# Patient Record
Sex: Female | Born: 1935 | Race: White | Hispanic: No | Marital: Married | Smoking: Never smoker
Health system: Southern US, Community
[De-identification: ages and names within clinical notes are randomized; demographics above are authoritative.]

## PROBLEM LIST (undated history)

## (undated) DIAGNOSIS — I714 Abdominal aortic aneurysm, without rupture, unspecified: Secondary | ICD-10-CM

## (undated) DIAGNOSIS — I4729 Other ventricular tachycardia: Secondary | ICD-10-CM

## (undated) DIAGNOSIS — I779 Disorder of arteries and arterioles, unspecified: Secondary | ICD-10-CM

## (undated) DIAGNOSIS — I480 Paroxysmal atrial fibrillation: Secondary | ICD-10-CM

## (undated) DIAGNOSIS — R739 Hyperglycemia, unspecified: Secondary | ICD-10-CM

## (undated) DIAGNOSIS — I739 Peripheral vascular disease, unspecified: Secondary | ICD-10-CM

## (undated) DIAGNOSIS — K219 Gastro-esophageal reflux disease without esophagitis: Secondary | ICD-10-CM

## (undated) DIAGNOSIS — E871 Hypo-osmolality and hyponatremia: Secondary | ICD-10-CM

## (undated) DIAGNOSIS — I472 Ventricular tachycardia: Secondary | ICD-10-CM

## (undated) DIAGNOSIS — R7401 Elevation of levels of liver transaminase levels: Secondary | ICD-10-CM

## (undated) DIAGNOSIS — E039 Hypothyroidism, unspecified: Secondary | ICD-10-CM

## (undated) DIAGNOSIS — I10 Essential (primary) hypertension: Secondary | ICD-10-CM

## (undated) DIAGNOSIS — R740 Nonspecific elevation of levels of transaminase and lactic acid dehydrogenase [LDH]: Secondary | ICD-10-CM

## (undated) DIAGNOSIS — I2510 Atherosclerotic heart disease of native coronary artery without angina pectoris: Secondary | ICD-10-CM

## (undated) DIAGNOSIS — N189 Chronic kidney disease, unspecified: Secondary | ICD-10-CM

## (undated) DIAGNOSIS — I5022 Chronic systolic (congestive) heart failure: Secondary | ICD-10-CM

## (undated) DIAGNOSIS — E785 Hyperlipidemia, unspecified: Secondary | ICD-10-CM

## (undated) DIAGNOSIS — J449 Chronic obstructive pulmonary disease, unspecified: Secondary | ICD-10-CM

## (undated) HISTORY — PX: CORONARY ARTERY BYPASS GRAFT: SHX141

## (undated) HISTORY — PX: CHOLECYSTECTOMY: SHX55

## (undated) HISTORY — PX: CARDIAC CATHETERIZATION: SHX172

---

## 2015-08-21 ENCOUNTER — Inpatient Hospital Stay (HOSPITAL_COMMUNITY)
Admission: AD | Admit: 2015-08-21 | Discharge: 2015-09-24 | DRG: 286 | Disposition: E | Payer: Medicare Other | Source: Other Acute Inpatient Hospital | Attending: Internal Medicine | Admitting: Internal Medicine

## 2015-08-21 ENCOUNTER — Encounter (HOSPITAL_COMMUNITY): Payer: Self-pay | Admitting: Physician Assistant

## 2015-08-21 ENCOUNTER — Encounter (HOSPITAL_COMMUNITY): Admission: AD | Disposition: E | Payer: Self-pay | Source: Other Acute Inpatient Hospital | Attending: Internal Medicine

## 2015-08-21 ENCOUNTER — Inpatient Hospital Stay (HOSPITAL_COMMUNITY): Payer: Medicare Other

## 2015-08-21 DIAGNOSIS — I480 Paroxysmal atrial fibrillation: Secondary | ICD-10-CM | POA: Diagnosis present

## 2015-08-21 DIAGNOSIS — I255 Ischemic cardiomyopathy: Secondary | ICD-10-CM | POA: Diagnosis present

## 2015-08-21 DIAGNOSIS — Z66 Do not resuscitate: Secondary | ICD-10-CM | POA: Diagnosis present

## 2015-08-21 DIAGNOSIS — I5023 Acute on chronic systolic (congestive) heart failure: Secondary | ICD-10-CM | POA: Diagnosis present

## 2015-08-21 DIAGNOSIS — N184 Chronic kidney disease, stage 4 (severe): Secondary | ICD-10-CM | POA: Diagnosis present

## 2015-08-21 DIAGNOSIS — E871 Hypo-osmolality and hyponatremia: Secondary | ICD-10-CM | POA: Diagnosis present

## 2015-08-21 DIAGNOSIS — I724 Aneurysm of artery of lower extremity: Secondary | ICD-10-CM | POA: Diagnosis not present

## 2015-08-21 DIAGNOSIS — I447 Left bundle-branch block, unspecified: Secondary | ICD-10-CM | POA: Diagnosis present

## 2015-08-21 DIAGNOSIS — D649 Anemia, unspecified: Secondary | ICD-10-CM

## 2015-08-21 DIAGNOSIS — R570 Cardiogenic shock: Secondary | ICD-10-CM

## 2015-08-21 DIAGNOSIS — I2510 Atherosclerotic heart disease of native coronary artery without angina pectoris: Secondary | ICD-10-CM | POA: Diagnosis present

## 2015-08-21 DIAGNOSIS — E785 Hyperlipidemia, unspecified: Secondary | ICD-10-CM | POA: Diagnosis present

## 2015-08-21 DIAGNOSIS — J449 Chronic obstructive pulmonary disease, unspecified: Secondary | ICD-10-CM | POA: Diagnosis present

## 2015-08-21 DIAGNOSIS — I4729 Other ventricular tachycardia: Secondary | ICD-10-CM

## 2015-08-21 DIAGNOSIS — Z515 Encounter for palliative care: Secondary | ICD-10-CM | POA: Diagnosis present

## 2015-08-21 DIAGNOSIS — E039 Hypothyroidism, unspecified: Secondary | ICD-10-CM | POA: Diagnosis present

## 2015-08-21 DIAGNOSIS — Z951 Presence of aortocoronary bypass graft: Secondary | ICD-10-CM

## 2015-08-21 DIAGNOSIS — R740 Nonspecific elevation of levels of transaminase and lactic acid dehydrogenase [LDH]: Secondary | ICD-10-CM

## 2015-08-21 DIAGNOSIS — N179 Acute kidney failure, unspecified: Secondary | ICD-10-CM | POA: Diagnosis present

## 2015-08-21 DIAGNOSIS — Z955 Presence of coronary angioplasty implant and graft: Secondary | ICD-10-CM | POA: Diagnosis not present

## 2015-08-21 DIAGNOSIS — I729 Aneurysm of unspecified site: Secondary | ICD-10-CM | POA: Diagnosis not present

## 2015-08-21 DIAGNOSIS — N189 Chronic kidney disease, unspecified: Secondary | ICD-10-CM

## 2015-08-21 DIAGNOSIS — I472 Ventricular tachycardia: Secondary | ICD-10-CM | POA: Diagnosis present

## 2015-08-21 DIAGNOSIS — L7632 Postprocedural hematoma of skin and subcutaneous tissue following other procedure: Secondary | ICD-10-CM | POA: Diagnosis not present

## 2015-08-21 DIAGNOSIS — R0989 Other specified symptoms and signs involving the circulatory and respiratory systems: Secondary | ICD-10-CM

## 2015-08-21 DIAGNOSIS — I10 Essential (primary) hypertension: Secondary | ICD-10-CM | POA: Diagnosis present

## 2015-08-21 DIAGNOSIS — R0602 Shortness of breath: Secondary | ICD-10-CM

## 2015-08-21 DIAGNOSIS — I714 Abdominal aortic aneurysm, without rupture, unspecified: Secondary | ICD-10-CM | POA: Diagnosis present

## 2015-08-21 DIAGNOSIS — Z95828 Presence of other vascular implants and grafts: Secondary | ICD-10-CM

## 2015-08-21 DIAGNOSIS — I5022 Chronic systolic (congestive) heart failure: Secondary | ICD-10-CM | POA: Diagnosis present

## 2015-08-21 DIAGNOSIS — I739 Peripheral vascular disease, unspecified: Secondary | ICD-10-CM | POA: Diagnosis present

## 2015-08-21 DIAGNOSIS — K219 Gastro-esophageal reflux disease without esophagitis: Secondary | ICD-10-CM | POA: Diagnosis present

## 2015-08-21 DIAGNOSIS — I4891 Unspecified atrial fibrillation: Secondary | ICD-10-CM | POA: Diagnosis not present

## 2015-08-21 DIAGNOSIS — E876 Hypokalemia: Secondary | ICD-10-CM | POA: Diagnosis present

## 2015-08-21 DIAGNOSIS — Y838 Other surgical procedures as the cause of abnormal reaction of the patient, or of later complication, without mention of misadventure at the time of the procedure: Secondary | ICD-10-CM | POA: Diagnosis not present

## 2015-08-21 DIAGNOSIS — I130 Hypertensive heart and chronic kidney disease with heart failure and stage 1 through stage 4 chronic kidney disease, or unspecified chronic kidney disease: Principal | ICD-10-CM | POA: Diagnosis present

## 2015-08-21 DIAGNOSIS — R7401 Elevation of levels of liver transaminase levels: Secondary | ICD-10-CM | POA: Diagnosis present

## 2015-08-21 DIAGNOSIS — Z905 Acquired absence of kidney: Secondary | ICD-10-CM

## 2015-08-21 DIAGNOSIS — I509 Heart failure, unspecified: Secondary | ICD-10-CM | POA: Diagnosis not present

## 2015-08-21 DIAGNOSIS — I779 Disorder of arteries and arterioles, unspecified: Secondary | ICD-10-CM | POA: Diagnosis present

## 2015-08-21 DIAGNOSIS — R739 Hyperglycemia, unspecified: Secondary | ICD-10-CM | POA: Diagnosis present

## 2015-08-21 HISTORY — DX: Hypo-osmolality and hyponatremia: E87.1

## 2015-08-21 HISTORY — DX: Other ventricular tachycardia: I47.29

## 2015-08-21 HISTORY — PX: CARDIAC CATHETERIZATION: SHX172

## 2015-08-21 HISTORY — DX: Gastro-esophageal reflux disease without esophagitis: K21.9

## 2015-08-21 HISTORY — DX: Elevation of levels of liver transaminase levels: R74.01

## 2015-08-21 HISTORY — DX: Hyperglycemia, unspecified: R73.9

## 2015-08-21 HISTORY — DX: Hypothyroidism, unspecified: E03.9

## 2015-08-21 HISTORY — DX: Peripheral vascular disease, unspecified: I73.9

## 2015-08-21 HISTORY — DX: Hyperlipidemia, unspecified: E78.5

## 2015-08-21 HISTORY — DX: Chronic obstructive pulmonary disease, unspecified: J44.9

## 2015-08-21 HISTORY — DX: Chronic systolic (congestive) heart failure: I50.22

## 2015-08-21 HISTORY — DX: Essential (primary) hypertension: I10

## 2015-08-21 HISTORY — DX: Paroxysmal atrial fibrillation: I48.0

## 2015-08-21 HISTORY — DX: Abdominal aortic aneurysm, without rupture, unspecified: I71.40

## 2015-08-21 HISTORY — DX: Chronic kidney disease, unspecified: N18.9

## 2015-08-21 HISTORY — DX: Ventricular tachycardia: I47.2

## 2015-08-21 HISTORY — DX: Nonspecific elevation of levels of transaminase and lactic acid dehydrogenase (ldh): R74.0

## 2015-08-21 HISTORY — DX: Abdominal aortic aneurysm, without rupture: I71.4

## 2015-08-21 HISTORY — DX: Atherosclerotic heart disease of native coronary artery without angina pectoris: I25.10

## 2015-08-21 HISTORY — DX: Disorder of arteries and arterioles, unspecified: I77.9

## 2015-08-21 LAB — CBC WITH DIFFERENTIAL/PLATELET
BASOS ABS: 0 10*3/uL (ref 0.0–0.1)
BASOS PCT: 1 %
EOS ABS: 0.2 10*3/uL (ref 0.0–0.7)
EOS PCT: 3 %
HEMATOCRIT: 31.2 % — AB (ref 36.0–46.0)
Hemoglobin: 9.8 g/dL — ABNORMAL LOW (ref 12.0–15.0)
Lymphocytes Relative: 19 %
Lymphs Abs: 1.2 10*3/uL (ref 0.7–4.0)
MCH: 30.2 pg (ref 26.0–34.0)
MCHC: 31.4 g/dL (ref 30.0–36.0)
MCV: 96 fL (ref 78.0–100.0)
MONO ABS: 0.6 10*3/uL (ref 0.1–1.0)
MONOS PCT: 9 %
Neutro Abs: 4.1 10*3/uL (ref 1.7–7.7)
Neutrophils Relative %: 68 %
PLATELETS: 150 10*3/uL (ref 150–400)
RBC: 3.25 MIL/uL — ABNORMAL LOW (ref 3.87–5.11)
RDW: 16.9 % — AB (ref 11.5–15.5)
WBC: 6 10*3/uL (ref 4.0–10.5)

## 2015-08-21 LAB — COMPREHENSIVE METABOLIC PANEL
ALBUMIN: 2.7 g/dL — AB (ref 3.5–5.0)
ALK PHOS: 68 U/L (ref 38–126)
ALT: 86 U/L — ABNORMAL HIGH (ref 14–54)
ANION GAP: 10 (ref 5–15)
AST: 37 U/L (ref 15–41)
BILIRUBIN TOTAL: 1.2 mg/dL (ref 0.3–1.2)
BUN: 18 mg/dL (ref 6–20)
CALCIUM: 8.6 mg/dL — AB (ref 8.9–10.3)
CO2: 28 mmol/L (ref 22–32)
Chloride: 93 mmol/L — ABNORMAL LOW (ref 101–111)
Creatinine, Ser: 1.38 mg/dL — ABNORMAL HIGH (ref 0.44–1.00)
GFR calc non Af Amer: 36 mL/min — ABNORMAL LOW (ref >60)
GFR, EST AFRICAN AMERICAN: 41 mL/min — AB (ref >60)
GLUCOSE: 185 mg/dL — AB (ref 65–99)
POTASSIUM: 3.7 mmol/L (ref 3.5–5.1)
SODIUM: 131 mmol/L — AB (ref 135–145)
TOTAL PROTEIN: 5 g/dL — AB (ref 6.5–8.1)

## 2015-08-21 LAB — CARBOXYHEMOGLOBIN
Carboxyhemoglobin: 1.7 % — ABNORMAL HIGH (ref 0.5–1.5)
Methemoglobin: 0.7 % (ref 0.0–1.5)
O2 SAT: 55.7 %
TOTAL HEMOGLOBIN: 10 g/dL — AB (ref 12.0–16.0)

## 2015-08-21 LAB — MRSA PCR SCREENING: MRSA BY PCR: NEGATIVE

## 2015-08-21 LAB — PROTIME-INR
INR: 1.34 (ref 0.00–1.49)
PROTHROMBIN TIME: 16.7 s — AB (ref 11.6–15.2)

## 2015-08-21 LAB — T4, FREE: FREE T4: 1.48 ng/dL — AB (ref 0.61–1.12)

## 2015-08-21 LAB — TSH: TSH: 8.071 u[IU]/mL — AB (ref 0.350–4.500)

## 2015-08-21 LAB — MAGNESIUM: MAGNESIUM: 1.8 mg/dL (ref 1.7–2.4)

## 2015-08-21 SURGERY — RIGHT/LEFT HEART CATH AND CORONARY/GRAFT ANGIOGRAPHY
Anesthesia: LOCAL

## 2015-08-21 MED ORDER — ASPIRIN EC 81 MG PO TBEC
81.0000 mg | DELAYED_RELEASE_TABLET | Freq: Every day | ORAL | Status: DC
  Administered 2015-08-21 – 2015-08-30 (×9): 81 mg via ORAL
  Filled 2015-08-21 (×9): qty 1

## 2015-08-21 MED ORDER — HEPARIN (PORCINE) IN NACL 2-0.9 UNIT/ML-% IJ SOLN
INTRAMUSCULAR | Status: AC
  Filled 2015-08-21: qty 1000

## 2015-08-21 MED ORDER — SODIUM CHLORIDE 0.9 % IV SOLN: INTRAVENOUS | Status: AC

## 2015-08-21 MED ORDER — FUROSEMIDE 10 MG/ML IJ SOLN
INTRAMUSCULAR | Status: DC | PRN
  Administered 2015-08-21: 80 mg via INTRAVENOUS

## 2015-08-21 MED ORDER — IOHEXOL 350 MG/ML SOLN
INTRAVENOUS | Status: DC | PRN
  Administered 2015-08-21: 50 mL via INTRACARDIAC

## 2015-08-21 MED ORDER — MILRINONE IN DEXTROSE 20 MG/100ML IV SOLN
0.3750 ug/kg/min | INTRAVENOUS | Status: DC
  Administered 2015-08-21 – 2015-08-23 (×2): 0.25 ug/kg/min via INTRAVENOUS
  Filled 2015-08-21 (×2): qty 100

## 2015-08-21 MED ORDER — LIDOCAINE HCL (PF) 1 % IJ SOLN
INTRAMUSCULAR | Status: AC
  Filled 2015-08-21: qty 30

## 2015-08-21 MED ORDER — ONDANSETRON HCL 4 MG/2ML IJ SOLN
4.0000 mg | Freq: Four times a day (QID) | INTRAMUSCULAR | Status: DC | PRN
  Administered 2015-08-22 – 2015-08-30 (×11): 4 mg via INTRAVENOUS
  Filled 2015-08-21 (×10): qty 2

## 2015-08-21 MED ORDER — ACETAMINOPHEN 325 MG PO TABS
650.0000 mg | ORAL_TABLET | ORAL | Status: DC | PRN
  Administered 2015-08-23 – 2015-08-30 (×7): 650 mg via ORAL
  Filled 2015-08-21 (×8): qty 2

## 2015-08-21 MED ORDER — HEPARIN (PORCINE) IN NACL 100-0.45 UNIT/ML-% IJ SOLN
900.0000 [IU]/h | INTRAMUSCULAR | Status: DC
  Administered 2015-08-22: 900 [IU]/h via INTRAVENOUS
  Filled 2015-08-21 (×2): qty 250

## 2015-08-21 MED ORDER — AMIODARONE HCL IN DEXTROSE 360-4.14 MG/200ML-% IV SOLN
30.0000 mg/h | INTRAVENOUS | Status: DC
  Administered 2015-08-21 – 2015-08-31 (×18): 30 mg/h via INTRAVENOUS
  Filled 2015-08-21 (×22): qty 200

## 2015-08-21 MED ORDER — POTASSIUM CHLORIDE CRYS ER 20 MEQ PO TBCR
20.0000 meq | EXTENDED_RELEASE_TABLET | Freq: Two times a day (BID) | ORAL | Status: DC
  Administered 2015-08-21 – 2015-08-23 (×5): 20 meq via ORAL
  Filled 2015-08-21 (×5): qty 1

## 2015-08-21 MED ORDER — SODIUM CHLORIDE 0.9 % IJ SOLN
3.0000 mL | Freq: Two times a day (BID) | INTRAMUSCULAR | Status: DC
Start: 2015-08-21 — End: 2015-08-22
  Administered 2015-08-21: 3 mL via INTRAVENOUS

## 2015-08-21 MED ORDER — FUROSEMIDE 10 MG/ML IJ SOLN
80.0000 mg | Freq: Two times a day (BID) | INTRAMUSCULAR | Status: DC
  Administered 2015-08-21 – 2015-08-24 (×6): 80 mg via INTRAVENOUS
  Filled 2015-08-21 (×7): qty 8

## 2015-08-21 MED ORDER — ACETAMINOPHEN 325 MG PO TABS
650.0000 mg | ORAL_TABLET | ORAL | Status: DC | PRN
  Administered 2015-08-21 – 2015-08-22 (×3): 650 mg via ORAL
  Filled 2015-08-21 (×2): qty 2

## 2015-08-21 MED ORDER — FUROSEMIDE 10 MG/ML IJ SOLN
INTRAMUSCULAR | Status: AC
  Filled 2015-08-21: qty 4

## 2015-08-21 MED ORDER — LIDOCAINE HCL (PF) 1 % IJ SOLN
INTRAMUSCULAR | Status: DC | PRN
  Administered 2015-08-21: 17:00:00

## 2015-08-21 MED ORDER — MILRINONE IN DEXTROSE 20 MG/100ML IV SOLN
0.2500 ug/kg/min | INTRAVENOUS | Status: DC
  Administered 2015-08-21: 0.25 ug/kg/min via INTRAVENOUS
  Filled 2015-08-21: qty 100

## 2015-08-21 MED ORDER — SODIUM CHLORIDE 0.9 % IV SOLN: 250.0000 mL | INTRAVENOUS | Status: DC | PRN

## 2015-08-21 MED ORDER — LEVALBUTEROL HCL 0.63 MG/3ML IN NEBU
0.6300 mg | INHALATION_SOLUTION | Freq: Four times a day (QID) | RESPIRATORY_TRACT | Status: DC | PRN
  Administered 2015-08-21 – 2015-08-30 (×7): 0.63 mg via RESPIRATORY_TRACT
  Filled 2015-08-21 (×7): qty 3

## 2015-08-21 MED ORDER — ONDANSETRON HCL 4 MG/2ML IJ SOLN
4.0000 mg | Freq: Four times a day (QID) | INTRAMUSCULAR | Status: DC | PRN
  Filled 2015-08-21: qty 2

## 2015-08-21 MED ORDER — SODIUM CHLORIDE 0.9 % IJ SOLN
3.0000 mL | Freq: Two times a day (BID) | INTRAMUSCULAR | Status: DC
  Administered 2015-08-21 – 2015-08-30 (×10): 3 mL via INTRAVENOUS

## 2015-08-21 MED ORDER — SODIUM CHLORIDE 0.9 % IJ SOLN: 3.0000 mL | INTRAMUSCULAR | Status: DC | PRN

## 2015-08-21 MED ORDER — FENTANYL CITRATE (PF) 100 MCG/2ML IJ SOLN
INTRAMUSCULAR | Status: AC
  Filled 2015-08-21: qty 4

## 2015-08-21 MED ORDER — PANTOPRAZOLE SODIUM 40 MG PO TBEC
40.0000 mg | DELAYED_RELEASE_TABLET | Freq: Every day | ORAL | Status: DC
  Administered 2015-08-22 – 2015-08-31 (×10): 40 mg via ORAL
  Filled 2015-08-21 (×11): qty 1

## 2015-08-21 MED ORDER — FENTANYL CITRATE (PF) 100 MCG/2ML IJ SOLN
INTRAMUSCULAR | Status: DC | PRN
  Administered 2015-08-21: 25 ug via INTRAVENOUS

## 2015-08-21 MED ORDER — NITROGLYCERIN 1 MG/10 ML FOR IR/CATH LAB
INTRA_ARTERIAL | Status: AC
  Filled 2015-08-21: qty 10

## 2015-08-21 MED ORDER — LEVOTHYROXINE SODIUM 50 MCG PO TABS
50.0000 ug | ORAL_TABLET | Freq: Every day | ORAL | Status: DC
  Administered 2015-08-22 – 2015-08-26 (×5): 50 ug via ORAL
  Filled 2015-08-21 (×5): qty 1

## 2015-08-21 SURGICAL SUPPLY — 20 items
CATH INFINITI 5 FR IM (CATHETERS) ×2
CATH INFINITI 5 FR MPA2 (CATHETERS) ×2
CATH INFINITI 5 FR RCB (CATHETERS) ×2
CATH INFINITI 5FR MULTPACK ANG (CATHETERS) ×2
CATH SITESEER 5F MULTI A 2 (CATHETERS) ×2
CATH SITESEER 5F NTR (CATHETERS) ×2
CATH SWAN GANZ 7F STRAIGHT (CATHETERS) ×2
KIT HEART LEFT (KITS) ×2
PACK CARDIAC CATHETERIZATION (CUSTOM PROCEDURE TRAY) ×2
SHEATH PINNACLE 5F 10CM (SHEATH) ×2
SHEATH PINNACLE 7F 10CM (SHEATH) ×2
SYR MEDRAD MARK V 150ML (SYRINGE) ×2
TRANSDUCER W/STOPCOCK (MISCELLANEOUS) ×4
TUBING ART PRESS 72  MALE/FEM (TUBING) ×1
TUBING ART PRESS 72 MALE/FEM (TUBING) ×1
TUBING CIL FLEX 10 FLL-RA (TUBING) ×2
WIRE EMERALD 3MM-J .025X260CM (WIRE) ×2
WIRE EMERALD 3MM-J .035X150CM (WIRE) ×2
WIRE EMERALD 3MM-J .035X260CM (WIRE) ×2
WIRE HI TORQ VERSACORE-J 145CM (WIRE) ×2

## 2015-08-21 NOTE — Progress Notes (Signed)
ANTICOAGULATION CONSULT NOTE - Initial Consult  Pharmacy Consult for heparin Indication: chest pain/ACS  Allergies  Allergen Reactions  . Ciprofloxacin     dysuria  . Levofloxacin     unknown  . Metolazone     n/v  . Penicillins     Hives   . Prednisone     Thin skin   . Statins     unknown  . Sulfa Antibiotics     unknown    Patient Measurements: Weight: 163 lb 2.3 oz (74 kg)   Vital Signs: BP: 120/70 mmHg (10/28 1715) Pulse Rate: 93 (10/28 1715)  Labs: No results for input(s): HGB, HCT, PLT, APTT, LABPROT, INR, HEPARINUNFRC, CREATININE, CKTOTAL, CKMB, TROPONINI in the last 72 hours.  CrCl cannot be calculated (Unknown ideal weight.).   Medical History: Past Medical History  Diagnosis Date  . CAD (coronary artery disease)     a. s/p CABG 2003. b. s/p stenting x 2 in 2014  . Hypothyroidism   . PVD (peripheral vascular disease) (HCC)   . AAA (abdominal aortic aneurysm) (HCC)   . Carotid artery disease (HCC)     a. s/p RICA atherectomy in 2003.  Marland Kitchen. Essential hypertension   . Hyperlipidemia   . PAF (paroxysmal atrial fibrillation) (HCC)     a. Per d/c summary from 07/2015 [facility]).  . Chronic systolic CHF (congestive heart failure) (HCC)     a. Dx 07/2015 - EF 10-15%.  . CKD (chronic kidney disease)     a. Per d/c summary from 07/2015 [facility]) - baseline Cr not known.  Marland Kitchen. COPD (chronic obstructive pulmonary disease) (HCC)   . Hyperglycemia   . Transaminitis     a. Per d/c summary from 07/2015 [facility]) - also had during later admission to [facility].   . Hyponatremia   . NSVT (nonsustained ventricular tachycardia) (HCC)     a. During 08/16/2015 admission [facility].  . GERD (gastroesophageal reflux disease)     Assessment: [age] YOF s/p cath to restart heparin 8h post-sheath removal. Sheath out at 1649. Was not on anticoagulation PTA, was on low dose ASA. No acute lesion leading  to decompensation per cath note- to be managed medically. Plans for TEE and DC-CV next week.  No labs yet this admission or on record. No excessive bleeding noted in cath.  Goal of Therapy:  Heparin level 0.3-0.7 units/ml Monitor platelets by anticoagulation protocol: Yes   Plan:  -start heparin at 900 units/hr at 0100 -heparin level and CBC at 0900 -Daily HL and CBC -follow for DC-CV planning and long term anticoag plans  Rilda Bulls D. Adalida Garver, PharmD, BCPS Clinical Pharmacist Pager: [phone] 08/09/2015 6:12 PM

## 2015-08-21 NOTE — Progress Notes (Addendum)
Site area: RFA/RFV Site Prior to Removal:  Level 0 Pressure Applied For:6440min Manual:  yes  Patient Status During Pull: stable  Post Pull Site:  Level 0 Post Pull Instructions Given:  yes Post Pull Pulses Present: doppler Dressing Applied: clear  Bedrest begins @ 1805 till 2205 Comments:

## 2015-08-21 NOTE — Progress Notes (Signed)
Site area: RFA/RFV Site Prior to Removal:  Level 0 Pressure Applied For:6620min Manual:   yes Patient Status During Pull: stable  Post Pull Site:  Level 0 Post Pull Instructions Given: yes  Post Pull Pulses Present: doppler Dressing Applied clear:   Bedrest begins @  Comments:

## 2015-08-21 NOTE — Interval H&P Note (Signed)
History and Physical Interval Note:  12-11-14 3:10 PM  Kathleen Higgins  has presented today for surgery, with the diagnosis of chf  The various methods of treatment have been discussed with the patient and family. After consideration of risks, benefits and other options for treatment, the patient has consented to  Procedure(s): Right/Left Heart Cath and Coronary/Graft Angiography (N/A) with possible angioplasty as a surgical intervention .  The patient's history has been reviewed, patient examined, no change in status, stable for surgery.  I have reviewed the patient's chart and labs.  Questions were answered to the patient's satisfaction.     Bensimhon, Reuel Boomaniel

## 2015-08-21 NOTE — Progress Notes (Addendum)
Reviewed records from [facility]- per their records, Home meds included: Aspirin 81mg  daily Diltiazem 180mg  daily KDur 20mg  BID Lasix 60mg  daily Toprol 25mg  BID Synthroid 25mcg daily  Medications upon discharge from [facility] today: Tylenol PRN Tessalon PRN Dulcolax PRN Tussionex PRN NTG SL PRN Zofran PRN Phenergan PRN Senna PRN Oxycodone PRN Restoril 15mg  PRN KCl 20meq BID Lasix 80mg  q8hr Levothyroxine 50mcg daily Lovenox 80mg  q12hr Amiodarone drip - 16.62266mL/hr Albuterol neb 3mL q6 PRN  Discussed med plan with Dr. Gala RomneyBensimhon - orders written - we are doing Lasix 80mg  IV BID, KCl 20meq BID, aspirin 81mg  daily, levothyroxine 50mcg, Protonix, amiodarone 30/hr.  Deferring order for heparin per pharmacy post-cath to MD provided she doesn't have bleeding issues during cath.  Dayna Dunn PA-C

## 2015-08-21 NOTE — Progress Notes (Signed)
Pt arrived via [facility] to cath holding. Pt awake and oriented, MAE,o2 sat 95 on 2LNC. BP 117/80.A-fib at 93Amiodarone at 16.7cc/h  30 MG/H.

## 2015-08-21 NOTE — H&P (Signed)
HPI:  80 y/o woman with h/o CAD s/p CABG 2003, stent RCA 2014 ([place]), systolic HF EF ~15%, AF (onset 8/16), carotid stenosis (s/p R CEA 2003), CRI with solitary kidney (lost one due to stones), AAA transferred from [facility] for further management of cardiogenic shock.   Says she was active this summer working around the garden. In August admitted to hospital with new onset AF. EF 25% Placed on coumadin and diltiazem. INR went to 7 so coumadin stopped earlier this month. Since that time has had increasing weakness and HF symptoms with Class IIIB symptoms.   [facility] several days ago with volume overload, hypotension and worsening renal function. Echo with EF 10-15%. Started on dobutamine and IV lasix with good result. CR 1.8 -> 1.1. Developed VT. Dobutamine stopped. Amio started. Currently has worsening SOB, +orthopnea and PND. Occasional chest tightness. TSH was 11 so synthroid increased.    Review of Systems:     Cardiac Review of Systems: {Y] = yes [ ]  = no  Chest Pain [ y   ]  Resting SOB [  y ] Exertional SOB  Cove.Etienne[y  ]  Orthopnea [ y ]   Pedal Edema [  y ]    Palpitations [  ] Syncope  [  ]   Presyncope [   ]  General Review of Systems: [Y] = yes [  ]=no Constitional: recent weight change [ y ]; anorexia [  y]; fatigue [  y]; nausea [  y]; night sweats [  ]; fever [  ]; or chills [  ];                                                                     Dental: poor dentition[  ];   Eye : blurred vision [  ]; diplopia [   ]; vision changes [  ];  Amaurosis fugax[  ]; Resp: cough Cove.Etienne[y  ];  wheezing[  ];  hemoptysis[  ]; shortness of breath[y  ]; paroxysmal nocturnal dyspnea[ y ]; dyspnea on exertion[ y ]; or orthopnea[ y ];  GI:  gallstones[  ], vomiting[  ];  dysphagia[  ]; melena[  ];  hematochezia [  ]; heartburn[  y];   GU: kidney stones [ y ]; hematuria[  ];   dysuria [  ];  nocturia[  ];               Skin: rash [  ], swelling[  ];, hair loss[  ];   peripheral edema[  ];  or itching[  ]; Musculosketetal: myalgias[  ];  joint swelling[  ];  joint erythema[  ];  joint pain[  ];  back pain[  ];  Heme/Lymph: bruising[  ];  bleeding[  ];  anemia[  ];  Neuro: TIA[  ];  headaches[  ];  stroke[  ];  vertigo[  ];  seizures[  ];   paresthesias[  ];  difficulty walking[  ];  Psych:depression[  ]; anxiety[  ];  Endocrine: diabetes[  ];  thyroid dysfunction[ y ];  Other:  Past Medical History:  1. Chronic systolic HF, EF 15%. Severe RV dysfunction.  2. CAD s/p CABG 2003, PCI RCA 2014 3. PAF onset  05/2015 4. PAD with AAA 5. Carotid stenosis s/p R CEA 6. LBBB 7. GERD 8. Hypothyroidism  9. CKD stage III-IV    -ho   Transfer meds: Lasix 40 iv bid Lovenox Amiodarone Synthroid.  Ecasa 81 daily Calcium Albuterol nebs prn   Allergies: cipro, metolazone (nausea), PCN (hives), prednisone (thin skin), statins (unknown)  Social History   Social History  . Marital Status: Married    Spouse Name: N/A  . Number of Children: N/A  . Years of Education: N/A   Occupational History  . Not on file.   Social History Main Topics  . Smoking status: Not on file  . Smokeless tobacco: Not on file  . Alcohol Use: Not on file  . Drug Use: Not on file  . Sexual Activity: Not on file   Other Topics Concern  . Not on file   Social History Narrative  . No narrative on file   Family history:  Brother and daughter with CAD  PHYSICAL EXAM: 107/60 HR 100 sat 97% 2L  General:  Chronically ill-appearing. No respiratory difficulty HEENT: normal Neck: supple. JVP to jaw. R CEA scar Carotids 2+ bilat; no bruits. No lymphadenopathy or thryomegaly appreciated. Cor: PMI laterally displaced. Irregular rate & rhythm. No rubs, gallops or murmurs. Lungs: clear with decreased BS throughout Abdomen: soft, nontender, nondistended. No hepatosplenomegaly. No bruits or masses. Good bowel sounds. Extremities: no cyanosis, clubbing, rash, 1+ edema Neuro: alert  & oriented x 3, cranial nerves grossly intact. moves all 4 extremities w/o difficulty. Affect pleasant.  ECG: AF with LBBB  No results found for this or any previous visit (from the past 24 hour(s)). No results found.   ASSESSMENT: 1. Cardiogenic shock 2. A/C systolic HF EF 10-15% 3. CAD s/p CABG 2003, PCI RCA 2014 4. PAF, onset 8/14    --coumadin stopped due to elevated INR 5. LBBB 6. PAD 7. COPD 8. A/C renal failure, stage 4    -h/o solitary kidney due to kidney stones.  9. VT  PLAN/DISCUSSION:  She has low output HF in the setting of severe ischemic CM. Clinical course and EF seem to have worsened dramatically with with onset of AF in 05/2013. Will plan R and L heart cath today to make sure revascularization intact. Will likely need inotropic support in near term. Will plan TEE and possible DC-CV early next week (will use Eliquis given intolerance of coumadin). Admit ICU.   Maleki Hippe,MD 3:06 PM

## 2015-08-22 DIAGNOSIS — N179 Acute kidney failure, unspecified: Secondary | ICD-10-CM

## 2015-08-22 DIAGNOSIS — N189 Chronic kidney disease, unspecified: Secondary | ICD-10-CM

## 2015-08-22 LAB — CBC
HCT: 27.9 % — ABNORMAL LOW (ref 36.0–46.0)
HEMATOCRIT: 27.5 % — AB (ref 36.0–46.0)
HEMOGLOBIN: 9.1 g/dL — AB (ref 12.0–15.0)
Hemoglobin: 9 g/dL — ABNORMAL LOW (ref 12.0–15.0)
MCH: 31.2 pg (ref 26.0–34.0)
MCH: 31.1 pg (ref 26.0–34.0)
MCHC: 32.6 g/dL (ref 30.0–36.0)
MCHC: 32.7 g/dL (ref 30.0–36.0)
MCV: 95.5 fL (ref 78.0–100.0)
MCV: 95.2 fL (ref 78.0–100.0)
PLATELETS: 111 10*3/uL — AB (ref 150–400)
Platelets: 135 10*3/uL — ABNORMAL LOW (ref 150–400)
RBC: 2.92 MIL/uL — AB (ref 3.87–5.11)
RBC: 2.89 MIL/uL — ABNORMAL LOW (ref 3.87–5.11)
RDW: 16.6 % — ABNORMAL HIGH (ref 11.5–15.5)
RDW: 16.4 % — AB (ref 11.5–15.5)
WBC: 7 10*3/uL (ref 4.0–10.5)
WBC: 7 10*3/uL (ref 4.0–10.5)

## 2015-08-22 LAB — LIPID PANEL
CHOL/HDL RATIO: 5.7 ratio
Cholesterol: 113 mg/dL (ref 0–200)
HDL: 20 mg/dL — ABNORMAL LOW (ref >40)
LDL Cholesterol: 69 mg/dL (ref 0–99)
Triglycerides: 122 mg/dL (ref <150)
VLDL: 24 mg/dL (ref 0–40)

## 2015-08-22 LAB — CARBOXYHEMOGLOBIN
Carboxyhemoglobin: 1.7 % — ABNORMAL HIGH (ref 0.5–1.5)
METHEMOGLOBIN: 0.4 % (ref 0.0–1.5)
O2 SAT: 59.7 %
TOTAL HEMOGLOBIN: 6.6 g/dL — AB (ref 12.0–16.0)

## 2015-08-22 LAB — BASIC METABOLIC PANEL
Anion gap: 14 (ref 5–15)
BUN: 21 mg/dL — AB (ref 6–20)
CALCIUM: 8.1 mg/dL — AB (ref 8.9–10.3)
CO2: 28 mmol/L (ref 22–32)
Chloride: 84 mmol/L — ABNORMAL LOW (ref 101–111)
Creatinine, Ser: 1.51 mg/dL — ABNORMAL HIGH (ref 0.44–1.00)
GFR calc Af Amer: 37 mL/min — ABNORMAL LOW (ref >60)
GFR, EST NON AFRICAN AMERICAN: 32 mL/min — AB (ref >60)
GLUCOSE: 270 mg/dL — AB (ref 65–99)
Potassium: 3.7 mmol/L (ref 3.5–5.1)
Sodium: 126 mmol/L — ABNORMAL LOW (ref 135–145)

## 2015-08-22 LAB — HEMOGLOBIN A1C
Hgb A1c MFr Bld: 6.5 % — ABNORMAL HIGH (ref 4.8–5.6)
Mean Plasma Glucose: 140 mg/dL

## 2015-08-22 LAB — HEPATIC FUNCTION PANEL
ALT: 77 U/L — ABNORMAL HIGH (ref 14–54)
AST: 32 U/L (ref 15–41)
Albumin: 2.7 g/dL — ABNORMAL LOW (ref 3.5–5.0)
Alkaline Phosphatase: 62 U/L (ref 38–126)
BILIRUBIN DIRECT: 0.3 mg/dL (ref 0.1–0.5)
BILIRUBIN INDIRECT: 0.6 mg/dL (ref 0.3–0.9)
BILIRUBIN TOTAL: 0.9 mg/dL (ref 0.3–1.2)
Total Protein: 4.7 g/dL — ABNORMAL LOW (ref 6.5–8.1)

## 2015-08-22 LAB — HEPARIN LEVEL (UNFRACTIONATED)
HEPARIN UNFRACTIONATED: 0.69 [IU]/mL (ref 0.30–0.70)
HEPARIN UNFRACTIONATED: 0.52 [IU]/mL (ref 0.30–0.70)

## 2015-08-22 MED ORDER — POLYETHYLENE GLYCOL 3350 17 G PO PACK
17.0000 g | PACK | Freq: Every day | ORAL | Status: DC
  Administered 2015-08-22 – 2015-08-30 (×4): 17 g via ORAL
  Filled 2015-08-22 (×6): qty 1

## 2015-08-22 MED ORDER — HEPARIN (PORCINE) IN NACL 100-0.45 UNIT/ML-% IJ SOLN
850.0000 [IU]/h | INTRAMUSCULAR | Status: DC
  Administered 2015-08-23 – 2015-08-24 (×2): 850 [IU]/h via INTRAVENOUS
  Filled 2015-08-22 (×2): qty 250

## 2015-08-22 MED ORDER — CETYLPYRIDINIUM CHLORIDE 0.05 % MT LIQD
7.0000 mL | Freq: Two times a day (BID) | OROMUCOSAL | Status: DC
  Administered 2015-08-22 – 2015-08-31 (×14): 7 mL via OROMUCOSAL

## 2015-08-22 NOTE — Progress Notes (Signed)
ANTICOAGULATION CONSULT NOTE - Follow Up Consult  Pharmacy Consult for heparin Indication: chest pain/ACS and atrial fibrillation  Allergies  Allergen Reactions  . Ciprofloxacin Other (See Comments)    dysuria  . Metolazone Nausea And Vomiting  . Penicillins Hives  . Prednisone Other (See Comments)    Thin skin   . Torsemide Other (See Comments)    dysuria  . Levofloxacin Other (See Comments)    unknown  . Statins Other (See Comments)    unknown  . Sulfa Antibiotics Other (See Comments)    unknown    Patient Measurements: Height: 5\' 2"  (157.5 cm) Weight: 167 lb 12.3 oz (76.1 kg) IBW/kg (Calculated) : 50.1 Heparin Dosing Weight: 66.7 kg  Vital Signs: Temp: 97.3 F (36.3 C) (10/29 0718) Temp Source: Oral (10/29 0718) BP: 125/57 mmHg (10/29 0800) Pulse Rate: 89 (10/29 0800)  Labs:  Recent Labs  09/11/2015 1900 08/22/15 0553 08/22/15 0815  HGB 9.8* 9.1* 9.0*  HCT 31.2* 27.9* 27.5*  PLT 150 111* 135*  LABPROT 16.7*  --   --   INR 1.34  --   --   HEPARINUNFRC  --   --  0.69  CREATININE 1.38* 1.51*  --     Estimated Creatinine Clearance: 29.3 mL/min (by C-G formula based on Cr of 1.51).   Medications:  Infusions:  . amiodarone 30 mg/hr (09/14/2015 1900)  . heparin    . milrinone 0.25 mcg/kg/min (09/03/2015 1900)    Assessment: [age] YOF admitted [facility]05-[facility] from [facility] for cardiogenic shock mgmt. Presented to OSH with vol overload, hypotension, & worsening renal function and started on dobutamine & IV Lasix. Prev on warfarin for AFib stopped earlier this mo d/t supratherapeutic level to 7 and no longer on anticoagulation. Pt CHADS2VASc score of at least 5. Cardiac cath yesterday showed severe 3-vessel disease with chronic occlusion of LM and prox RCA and profound cardiogenic shock w/ elevated filling pressures. Med mgmt initiated for CAD and pt started on milrinone for low output.  HL 0.69, therapeutic on 900 units/h. Hgb 9, plt 135. Groin bleeding noted after  sheath pulled. HL on higher end of therapeutic goal, will decrease to prevent supratherapeutic level.    Goal of Therapy:  Heparin level 0.3-0.7 units/ml Monitor platelets by anticoagulation protocol: Yes   Plan:  Decrease to heparin 850 units/h 1930 confirmatory HL Daily HL, CBC Monitor s/sx bleeding    Hillery AldoElizabeth Manie Bealer, Pharm.D. PGY2 Cardiology Pharmacy Resident Pager: [phone]  08/22/2015,11:15 AM

## 2015-08-22 NOTE — Progress Notes (Addendum)
Advanced Heart Failure Rounding Note   Subjective:    RHC 10/28 with marked volume overload and low CO. Started on milrinone. Co-ox improved 34 -> 60%. However this am back down to 34%. Milrinone increased to 0.375.   Continues to feel dyspneic, weak. Can't get comfortable. Kathleen Higgins. CVP 14   Had groin bleed after sheath pull. Hgb stable 9.8-> 9.1->9.2   Objective:   Weight Range:  Vital Signs:   Temp:  [97.3 F (36.3 C)-98 F (36.7 C)] 97.3 F (36.3 C) (10/29 0718) Pulse Rate:  [67-114] 89 (10/29 0800) Resp:  [10-25] 18 (10/29 0800) BP: (93-134)/(49-119) 125/57 mmHg (10/29 0800) SpO2:  [89 %-100 %] 99 % (10/29 0800) Weight:  [74 kg (163 lb 2.3 oz)-76.5 kg (168 lb 10.4 oz)] 76.1 kg (167 lb 12.3 oz) (10/29 0530)    Weight change: Filed Weights   08/19/2015 1617 08/16/2015 1846 08/22/15 0530  Weight: 74 kg (163 lb 2.3 oz) 76.5 kg (168 lb 10.4 oz) 76.1 kg (167 lb 12.3 oz)    Intake/Output:   Intake/Output Summary (Last 24 hours) at 08/22/15 1045 Last data filed at 08/22/15 1036  Gross per 24 hour  Intake 434.91 ml  Output   1450 ml  Net -1015.09 ml     Physical Exam: CVP 12 General: Chronically ill-appearing. No respiratory difficulty HEENT: normal Neck: supple. JVP to jaw. R CEA scar Carotids 2+ bilat; no bruits. No lymphadenopathy or thryomegaly appreciated. Cor: PMI laterally displaced. Irregular rate & rhythm. No rubs, gallops or murmurs. Lungs: clear with decreased BS throughout Abdomen: soft, nontender, nondistended. No hepatosplenomegaly. No bruits or masses. Good bowel sounds. Extremities: no cyanosis, clubbing, rash, 1+ edema large r groin ecchymosis. No bruits.  Neuro: alert & oriented x 3, cranial nerves grossly intact. moves all 4 extremities w/o difficulty. Affect pleasant.    Telemetry: AF 80s  Labs: Basic Metabolic Panel:  Recent Labs Lab 07/31/2015 1900 08/22/15 0553  NA 131* 126*  K 3.7 3.7  CL 93* 84*  CO2 28 28  GLUCOSE 185* 270*  BUN 18  21*  CREATININE 1.38* 1.51*  CALCIUM 8.6* 8.1*  MG 1.8  --     Liver Function Tests:  Recent Labs Lab 07/29/2015 1900 08/22/15 0553  AST 37 32  ALT 86* 77*  ALKPHOS 68 62  BILITOT 1.2 0.9  PROT 5.0* 4.7*  ALBUMIN 2.7* 2.7*   No results for input(s): LIPASE, AMYLASE in the last 168 hours. No results for input(s): AMMONIA in the last 168 hours.  CBC:  Recent Labs Lab 07/29/2015 1900 08/22/15 0553 08/22/15 0815  WBC 6.0 7.0 7.0  NEUTROABS 4.1  --   --   HGB 9.8* 9.1* 9.0*  HCT 31.2* 27.9* 27.5*  MCV 96.0 95.5 95.2  PLT 150 111* 135*    Cardiac Enzymes: No results for input(s): CKTOTAL, CKMB, CKMBINDEX, TROPONINI in the last 168 hours.  BNP: BNP (last 3 results) No results for input(s): BNP in the last 8760 hours.  ProBNP (last 3 results) No results for input(s): PROBNP in the last 8760 hours.    Other results:  Imaging: Dg Chest Port 1 View  08/10/2015  CLINICAL DATA:  PICC placement.  Initial encounter. EXAM: PORTABLE CHEST 1 VIEW COMPARISON:  None. FINDINGS: A right PICC is noted ending about the mid SVC. The lungs are well-aerated. Vascular congestion is noted. Mild bilateral atelectasis is seen. There is no evidence of pleural effusion or pneumothorax. The cardiomediastinal silhouette is enlarged. The patient is  status post median sternotomy. No acute osseous abnormalities are seen. IMPRESSION: 1. Right PICC noted ending about the mid SVC. 2. Vascular congestion and cardiomegaly. Mild bilateral atelectasis seen. Electronically Signed   By: Roanna Raider M.D.   On: Sep 09, 2015 22:18      Medications:     Scheduled Medications: . antiseptic oral rinse  7 mL Mouth Rinse BID  . aspirin EC  81 mg Oral Daily  . furosemide  80 mg Intravenous BID  . levothyroxine  50 mcg Oral QAC breakfast  . pantoprazole  40 mg Oral Q0600  . potassium chloride  20 mEq Oral BID  . sodium chloride  3 mL Intravenous Q12H  . sodium chloride  3 mL Intravenous Q12H      Infusions: . amiodarone 30 mg/hr (2015-09-09 1900)  . heparin 900 Units/hr (08/22/15 0130)  . milrinone 0.25 mcg/kg/min (Sep 09, 2015 1900)     PRN Medications:  sodium chloride, sodium chloride, acetaminophen, acetaminophen, levalbuterol, ondansetron (ZOFRAN) IV, ondansetron (ZOFRAN) IV, sodium chloride, sodium chloride   Assessment:   1. Cardiogenic shock 2. A/C systolic HF EF 10-15% 3. CAD s/p CABG 2003, PCI RCA 2014 4. PAF, onset 8/14  --coumadin stopped due to elevated INR 5. LBBB 6. PAD 7. COPD 8. A/C renal failure, stage 4  -h/o solitary kidney due to kidney stones.  9. VT 10. R groin hematoma  Plan/Discussion:    Remains quite tenuous. CO initially improved with milrinone but co-ox now back down. Milrinone increased this am. Will repeat co-ox. If co-ox remains down will switch to dobutamine.   CXR still wet. CVP 14 Continue IV lasix. Watch renal function closely. At high-risk for CIN.   Suspect onset of AF in 8/16 is trigger for recent decompensation. Now ion iv amio and heparin. Plan TEE and DC-CV on Monday. Has not tolerated coumadin in past due to labile INRs. Will use Eliquis once groin site completely stable.   The patient is critically ill with multiple organ systems failure and requires high complexity decision making for assessment and support, frequent evaluation and titration of therapies, application of advanced monitoring technologies and extensive interpretation of multiple databases.   Critical Care Time devoted to patient care services described in this note is 35 Minutes.    Length of Stay: 1  Sadiyah Kangas MD 08/22/2015, 10:45 AM  Advanced Heart Failure Team Pager [phone] (M-F; 7a - 4p)  Please contact [facility] for night-coverage after hours (4p -7a ) and weekends on [email or web address]

## 2015-08-22 NOTE — Progress Notes (Signed)
ANTICOAGULATION CONSULT NOTE - Follow Up Consult  Pharmacy Consult for heparin Indication: chest pain/ACS and atrial fibrillation  Allergies  Allergen Reactions  . Ciprofloxacin Other (See Comments)    dysuria  . Metolazone Nausea And Vomiting  . Penicillins Hives  . Prednisone Other (See Comments)    Thin skin   . Torsemide Other (See Comments)    dysuria  . Levofloxacin Other (See Comments)    unknown  . Statins Other (See Comments)    unknown  . Sulfa Antibiotics Other (See Comments)    unknown    Patient Measurements: Height: 5\' 2"  (157.5 cm) Weight: 167 lb 12.3 oz (76.1 kg) IBW/kg (Calculated) : 50.1 Heparin Dosing Weight: 66.7 kg  Vital Signs: Temp: 97.8 F (36.6 C) (10/29 1957) Temp Source: Oral (10/29 1957) BP: 113/73 mmHg (10/29 1900) Pulse Rate: 97 (10/29 1900)  Labs:  Recent Labs  08/16/2015 1900 08/22/15 0553 08/22/15 0815 08/22/15 1950  HGB 9.8* 9.1* 9.0*  --   HCT 31.2* 27.9* 27.5*  --   PLT 150 111* 135*  --   LABPROT 16.7*  --   --   --   INR 1.34  --   --   --   HEPARINUNFRC  --   --  0.69 0.52  CREATININE 1.38* 1.51*  --   --     Estimated Creatinine Clearance: 29.3 mL/min (by C-G formula based on Cr of 1.51).   Medications:  Infusions:  . amiodarone 30 mg/hr (08/02/2015 1900)  . heparin 850 Units/hr (08/22/15 1130)  . milrinone 0.25 mcg/kg/min (08/05/2015 1900)    Assessment: [age] YOF [facility]ogenic shock mgmt. Presented to OSH with vol overload, hypotension, & worsening renal function and started on dobutamine & IV Lasix. Prev on warfarin for AFib stopped earlier this mo d/t supratherapeutic level to 7 and no longer on anticoagulation. Pt CHADS2VASc score of at least 5. Cardiac cath yesterday showed severe 3-vessel disease with chronic occlusion of LM and prox RCA and profound cardiogenic shock w/ elevated filling pressures. Med mgmt initiated for CAD and pt started on milrinone for low output.  HL 0.5  on 850 units/hr. No bleeding issues noted.  Goal of Therapy:  Heparin level 0.3-0.7 units/ml Monitor platelets by anticoagulation protocol: Yes   Plan:  Continue heparin at 850 units/h Daily HL, CBC Monitor s/sx bleeding  Sheppard CoilFrank Wilson PharmD., BCPS Clinical Pharmacist Pager [phone] 08/22/2015 8:36 PM

## 2015-08-23 ENCOUNTER — Inpatient Hospital Stay (HOSPITAL_COMMUNITY): Payer: Medicare Other

## 2015-08-23 ENCOUNTER — Encounter (HOSPITAL_COMMUNITY): Payer: Self-pay | Admitting: *Deleted

## 2015-08-23 LAB — POCT I-STAT 3, ART BLOOD GAS (G3+)
Acid-Base Excess: 4 mmol/L — ABNORMAL HIGH (ref 0.0–2.0)
Bicarbonate: 27.9 mEq/L — ABNORMAL HIGH (ref 20.0–24.0)
O2 SAT: 97 %
TCO2: 29 mmol/L (ref 0–100)
pCO2 arterial: 39.7 mmHg (ref 35.0–45.0)
pH, Arterial: 7.453 — ABNORMAL HIGH (ref 7.350–7.450)
pO2, Arterial: 85 mmHg (ref 80.0–100.0)

## 2015-08-23 LAB — CARBOXYHEMOGLOBIN
Carboxyhemoglobin: 1 % (ref 0.5–1.5)
Carboxyhemoglobin: 1.3 % (ref 0.5–1.5)
Carboxyhemoglobin: 1.7 % — ABNORMAL HIGH (ref 0.5–1.5)
Carboxyhemoglobin: 1.9 % — ABNORMAL HIGH (ref 0.5–1.5)
METHEMOGLOBIN: 0.8 % (ref 0.0–1.5)
METHEMOGLOBIN: 0.7 % (ref 0.0–1.5)
Methemoglobin: 0.9 % (ref 0.0–1.5)
Methemoglobin: 0.6 % (ref 0.0–1.5)
O2 SAT: 34.1 %
O2 Saturation: 25.7 %
O2 Saturation: 48.3 %
O2 Saturation: 57.5 %
TOTAL HEMOGLOBIN: 9.2 g/dL — AB (ref 12.0–16.0)
TOTAL HEMOGLOBIN: 8.4 g/dL — AB (ref 12.0–16.0)
Total hemoglobin: 9.3 g/dL — ABNORMAL LOW (ref 12.0–16.0)
Total hemoglobin: 8.9 g/dL — ABNORMAL LOW (ref 12.0–16.0)

## 2015-08-23 LAB — BASIC METABOLIC PANEL
Anion gap: 12 (ref 5–15)
BUN: 22 mg/dL — ABNORMAL HIGH (ref 6–20)
CHLORIDE: 87 mmol/L — AB (ref 101–111)
CO2: 29 mmol/L (ref 22–32)
Calcium: 9 mg/dL (ref 8.9–10.3)
Creatinine, Ser: 1.58 mg/dL — ABNORMAL HIGH (ref 0.44–1.00)
GFR calc non Af Amer: 30 mL/min — ABNORMAL LOW (ref >60)
GFR, EST AFRICAN AMERICAN: 35 mL/min — AB (ref >60)
Glucose, Bld: 151 mg/dL — ABNORMAL HIGH (ref 65–99)
POTASSIUM: 4.4 mmol/L (ref 3.5–5.1)
SODIUM: 128 mmol/L — AB (ref 135–145)

## 2015-08-23 LAB — BRAIN NATRIURETIC PEPTIDE: B NATRIURETIC PEPTIDE 5: 1734.9 pg/mL — AB (ref 0.0–100.0)

## 2015-08-23 LAB — CBC
HEMATOCRIT: 28.4 % — AB (ref 36.0–46.0)
Hemoglobin: 9.2 g/dL — ABNORMAL LOW (ref 12.0–15.0)
MCH: 30.6 pg (ref 26.0–34.0)
MCHC: 32.4 g/dL (ref 30.0–36.0)
MCV: 94.4 fL (ref 78.0–100.0)
Platelets: 145 10*3/uL — ABNORMAL LOW (ref 150–400)
RBC: 3.01 MIL/uL — AB (ref 3.87–5.11)
RDW: 16.6 % — ABNORMAL HIGH (ref 11.5–15.5)
WBC: 9.8 10*3/uL (ref 4.0–10.5)

## 2015-08-23 LAB — HEPARIN LEVEL (UNFRACTIONATED): Heparin Unfractionated: 0.56 IU/mL (ref 0.30–0.70)

## 2015-08-23 LAB — MAGNESIUM: Magnesium: 1.8 mg/dL (ref 1.7–2.4)

## 2015-08-23 MED ORDER — METOLAZONE 5 MG PO TABS
5.0000 mg | ORAL_TABLET | Freq: Once | ORAL | Status: AC
Start: 2015-08-23 — End: 2015-08-23
  Administered 2015-08-23: 5 mg via ORAL
  Filled 2015-08-23: qty 1

## 2015-08-23 MED ORDER — DOBUTAMINE IN D5W 4-5 MG/ML-% IV SOLN
5.0000 ug/kg/min | INTRAVENOUS | Status: DC
  Administered 2015-08-23 – 2015-08-28 (×4): 5 ug/kg/min via INTRAVENOUS
  Filled 2015-08-23 (×4): qty 250

## 2015-08-23 MED ORDER — SORBITOL 70 % SOLN
30.0000 mL | Freq: Every day | Status: DC | PRN
  Administered 2015-08-23: 30 mL via ORAL
  Filled 2015-08-23: qty 30

## 2015-08-23 MED ORDER — DIPHENHYDRAMINE HCL 50 MG/ML IJ SOLN
25.0000 mg | Freq: Once | INTRAMUSCULAR | Status: AC
  Administered 2015-08-23: 25 mg via INTRAVENOUS
  Filled 2015-08-23: qty 1

## 2015-08-23 NOTE — Progress Notes (Signed)
Pt restless unable to get comfortable-coox was 25 rechecking paged Dr Virgina OrganQureshi awaiting for response.

## 2015-08-23 NOTE — Progress Notes (Signed)
ANTICOAGULATION CONSULT NOTE - Follow Up Consult  Pharmacy Consult for heparin Indication: chest pain/ACS and atrial fibrillation  Allergies  Allergen Reactions  . Ciprofloxacin Other (See Comments)    dysuria  . Metolazone Nausea And Vomiting  . Penicillins Hives  . Prednisone Other (See Comments)    Thin skin   . Torsemide Other (See Comments)    dysuria  . Levofloxacin Other (See Comments)    unknown  . Statins Other (See Comments)    unknown  . Sulfa Antibiotics Other (See Comments)    unknown    Patient Measurements: Height: 5\' 2"  (157.5 cm) Weight: 171 lb 11.8 oz (77.9 kg) IBW/kg (Calculated) : 50.1 Heparin Dosing Weight: 66.7 kg  Vital Signs: Temp: 97.8 F (36.6 C) (10/30 0800) Temp Source: Oral (10/30 0800) BP: 119/80 mmHg (10/30 0800) Pulse Rate: 69 (10/30 0800)  Labs:  Recent Labs  08/02/2015 1900 08/22/15 0553 08/22/15 0815 08/22/15 1950 08/23/15 0345 08/23/15 0430  HGB 9.8* 9.1* 9.0*  --   --  9.2*  HCT 31.2* 27.9* 27.5*  --   --  28.4*  PLT 150 111* 135*  --   --  145*  LABPROT 16.7*  --   --   --   --   --   INR 1.34  --   --   --   --   --   HEPARINUNFRC  --   --  0.69 0.52 0.56  --   CREATININE 1.38* 1.51*  --   --   --  1.58*    Estimated Creatinine Clearance: 28.4 mL/min (by C-G formula based on Cr of 1.58).   Medications:  Infusions:  . amiodarone 30 mg/hr (08/23/15 0350)  . DOBUTamine 5 mcg/kg/min (08/23/15 0924)  . heparin 850 Units/hr (08/23/15 0350)    Assessment: [age] YOF admitted 07/29/2015 [facility] for cardiogenic shock mgmt. Presented to OSH with vol overload, hypotension, & worsening renal function and started on dobutamine & IV Lasix. Prev on warfarin for AFib stopped earlier this mo d/t supratherapeutic level to 7 and no longer on anticoagulation. Pt CHADS2VASc score of at least 5. Cardiac cath on 10/28 showed severe 3-vessel disease with chronic occlusion of LM and prox RCA and profound cardiogenic shock w/ elevated  filling pressures. Med mgmt initiated for CAD and pt started on inotrope for low output.  HL 0.56 on 850 units/hr. CBC stable. No bleeding noted.  Goal of Therapy:  Heparin level 0.3-0.7 units/ml Monitor platelets by anticoagulation protocol: Yes   Plan:  Continue heparin at 850 units/h Daily HL, CBC Monitor s/sx bleeding    Hillery AldoElizabeth Jaion Lagrange, Pharm.D. PGY2 Cardiology Pharmacy Resident Pager: [phone]  08/23/2015 10:10 AM

## 2015-08-23 NOTE — Plan of Care (Signed)
Problem: Phase I Progression Outcomes Goal: Voiding-avoid urinary catheter unless indicated Outcome: Not Met (add Reason) Foley cath     

## 2015-08-23 NOTE — Evaluation (Signed)
Physical Therapy Evaluation Patient Details Name: Lezlie LyeVerdie Raspberry MRN: [id number] DOB: 1934-12-31 Today's Date: 08/23/2015   History of Present Illness  80 y/o woman Admitted to [facility] several days ago with volume overload, hypotension and worsening renal function  with h/o CAD s/p CABG 2003, stent RCA 2014 ([place]), systolic HF EF ~15%, AF (onset 8/16), carotid stenosis (s/p R CEA 2003), CRI with solitary kidney (lost one due to stones), AAA transferred [facility] for further management of cardiogenic shock. Cardiac cath with bleeding/hematoma at thigh post sheath pull  Clinical Impression   Pt admitted with above diagnosis. Pt currently with functional limitations due to the deficits listed below (see PT Problem List).  Pt will benefit from skilled PT to increase their independence and safety with mobility to allow discharge to the venue listed below.       Follow Up Recommendations SNF    Equipment Recommendations  Rolling walker with 5" wheels;3in1 (PT) (youth-sized RW)    Recommendations for Other Services       Precautions / Restrictions Precautions Precautions: Fall      Mobility  Bed Mobility                  Transfers Overall transfer level: Needs assistance Equipment used: Rolling walker (2 wheeled) Transfers: Sit to/from Stand Sit to Stand: Mod assist         General transfer comment: Mod assist to power up; cues for safety, control, and hand placement  Ambulation/Gait Ambulation/Gait assistance: +2 safety/equipment;Min assist Ambulation Distance (Feet): 45 Feet Assistive device: Rolling walker (2 wheeled) Gait Pattern/deviations: Step-through pattern Gait velocity: slowed   General Gait Details: Max encouragement to participate, and to push to walk more; Overall min assist for safety, chair pushed behind to boost patient's confidence and help incr activity safely; discussed pushing down into RW to Morgan Stanleyunweigh pain RLE  Stairs             Wheelchair Mobility    Modified Rankin (Stroke Patients Only)       Balance                                             Pertinent Vitals/Pain Pain Assessment: 0-10 Pain Score: 10-Worst pain ever Pain Location: R inner thigh/groin pain Pain Descriptors / Indicators: Aching;Discomfort;Guarding Pain Intervention(s): Limited activity within patient's tolerance;Monitored during session;Repositioned    Home Living Family/patient expects to be discharged to:: Private residence Living Arrangements: Spouse/significant other Available Help at Discharge: Family;Available 24 hours/day (Spouse can only provide Supervision; no physical asssit) Type of Home: Mobile home Home Access: Stairs to enter (need to verify)     Home Layout: One level Home Equipment: Walker - 2 wheels      Prior Function Level of Independence: Independent with assistive device(s)         Comments: Reports she furniture walks inside her home; uses rW when she goes out     Hand Dominance        Extremity/Trunk Assessment   Upper Extremity Assessment: Generalized weakness           Lower Extremity Assessment: Generalized weakness         Communication   Communication: No difficulties  Cognition Arousal/Alertness: Awake/alert Behavior During Therapy: WFL for tasks assessed/performed;Flat affect Overall Cognitive Status: Within Functional Limits for tasks assessed  General Comments General comments (skin integrity, edema, etc.): VSS    Exercises        Assessment/Plan    PT Assessment Patient needs continued PT services  PT Diagnosis Difficulty walking;Generalized weakness;Acute pain   PT Problem List Decreased strength;Decreased range of motion;Decreased activity tolerance;Decreased mobility;Decreased coordination;Decreased knowledge of use of DME;Decreased knowledge of precautions;Cardiopulmonary status limiting  activity;Pain  PT Treatment Interventions DME instruction;Gait training;Stair training;Functional mobility training;Therapeutic activities;Therapeutic exercise;Patient/family education   PT Goals (Current goals can be found in the Care Plan section) Acute Rehab PT Goals Patient Stated Goal: did not state PT Goal Formulation: With patient Time For Goal Achievement: 09/06/15 Potential to Achieve Goals: Good    Frequency Min 3X/week   Barriers to discharge Decreased caregiver support Husband can only give supervision    Co-evaluation               End of Session Equipment Utilized During Treatment: Oxygen Activity Tolerance: Patient tolerated treatment well;Patient limited by pain Patient left: in chair;with call bell/phone within reach Nurse Communication: Mobility status         Time: 0981-1914 PT Time Calculation (min) (ACUTE ONLY): 22 min   Charges:   PT Evaluation $Initial PT Evaluation Tier I: 1 Procedure     PT G CodesVan Clines Hamff 08/23/2015, 4:06 PM  Van Clines, [place]  Acute Rehabilitation Services Pager [phone] Office [phone]

## 2015-08-23 NOTE — Progress Notes (Signed)
PT Cancellation Note  Patient Details Name: Lezlie LyeVerdie Hovatter MRN: [id number] DOB: 08-23-1935   Cancelled Treatment:    Reason Eval/Treat Not Completed: Other (comment)   Had just got back to bed with RN;   Will follow up later today as time allows;  Otherwise, will follow up for PT tomorrow;   Thank you,  Van ClinesHolly Edan Serratore, PT  Acute Rehabilitation Services Pager [phone] Office [phone]     Van ClinesGarrigan, Hilda Wexler Hamff 08/23/2015, 10:07 AM

## 2015-08-23 NOTE — Progress Notes (Signed)
Recheck COOX 34,BNP,ABG and chest xray done.

## 2015-08-24 ENCOUNTER — Encounter (HOSPITAL_COMMUNITY): Payer: Self-pay | Admitting: Internal Medicine

## 2015-08-24 ENCOUNTER — Inpatient Hospital Stay (HOSPITAL_COMMUNITY): Payer: Medicare Other

## 2015-08-24 DIAGNOSIS — R0989 Other specified symptoms and signs involving the circulatory and respiratory systems: Secondary | ICD-10-CM

## 2015-08-24 DIAGNOSIS — I724 Aneurysm of artery of lower extremity: Secondary | ICD-10-CM

## 2015-08-24 LAB — POCT I-STAT 3, VENOUS BLOOD GAS (G3P V)
Acid-Base Excess: 3 mmol/L — ABNORMAL HIGH (ref 0.0–2.0)
Acid-Base Excess: 3 mmol/L — ABNORMAL HIGH (ref 0.0–2.0)
Bicarbonate: 28.1 mEq/L — ABNORMAL HIGH (ref 20.0–24.0)
Bicarbonate: 28.4 mEq/L — ABNORMAL HIGH (ref 20.0–24.0)
O2 Saturation: 34 %
O2 Saturation: 36 %
PCO2 VEN: 45.2 mmHg (ref 45.0–50.0)
PCO2 VEN: 45.9 mmHg (ref 45.0–50.0)
PH VEN: 7.401 — AB (ref 7.250–7.300)
PO2 VEN: 21 mmHg — AB (ref 30.0–45.0)
TCO2: 29 mmol/L (ref 0–100)
TCO2: 30 mmol/L (ref 0–100)
pH, Ven: 7.4 — ABNORMAL HIGH (ref 7.250–7.300)
pO2, Ven: 22 mmHg — CL (ref 30.0–45.0)

## 2015-08-24 LAB — POCT I-STAT 3, ART BLOOD GAS (G3+)
Acid-Base Excess: 3 mmol/L — ABNORMAL HIGH (ref 0.0–2.0)
BICARBONATE: 26.9 meq/L — AB (ref 20.0–24.0)
O2 Saturation: 93 %
PCO2 ART: 39.3 mmHg (ref 35.0–45.0)
PH ART: 7.443 (ref 7.350–7.450)
PO2 ART: 64 mmHg — AB (ref 80.0–100.0)
TCO2: 28 mmol/L (ref 0–100)

## 2015-08-24 LAB — BASIC METABOLIC PANEL
ANION GAP: 10 (ref 5–15)
ANION GAP: 12 (ref 5–15)
BUN: 20 mg/dL (ref 6–20)
BUN: 18 mg/dL (ref 6–20)
CALCIUM: 8.8 mg/dL — AB (ref 8.9–10.3)
CO2: 34 mmol/L — AB (ref 22–32)
CO2: 32 mmol/L (ref 22–32)
Calcium: 9 mg/dL (ref 8.9–10.3)
Chloride: 81 mmol/L — ABNORMAL LOW (ref 101–111)
Chloride: 78 mmol/L — ABNORMAL LOW (ref 101–111)
Creatinine, Ser: 1.45 mg/dL — ABNORMAL HIGH (ref 0.44–1.00)
Creatinine, Ser: 1.34 mg/dL — ABNORMAL HIGH (ref 0.44–1.00)
GFR calc Af Amer: 43 mL/min — ABNORMAL LOW (ref >60)
GFR, EST AFRICAN AMERICAN: 39 mL/min — AB (ref >60)
GFR, EST NON AFRICAN AMERICAN: 34 mL/min — AB (ref >60)
GFR, EST NON AFRICAN AMERICAN: 37 mL/min — AB (ref >60)
GLUCOSE: 144 mg/dL — AB (ref 65–99)
Glucose, Bld: 107 mg/dL — ABNORMAL HIGH (ref 65–99)
POTASSIUM: 2.9 mmol/L — AB (ref 3.5–5.1)
POTASSIUM: 3.8 mmol/L (ref 3.5–5.1)
Sodium: 125 mmol/L — ABNORMAL LOW (ref 135–145)
Sodium: 122 mmol/L — ABNORMAL LOW (ref 135–145)

## 2015-08-24 LAB — CBC
HCT: 24.4 % — ABNORMAL LOW (ref 36.0–46.0)
Hemoglobin: 8.1 g/dL — ABNORMAL LOW (ref 12.0–15.0)
MCH: 31 pg (ref 26.0–34.0)
MCHC: 33.2 g/dL (ref 30.0–36.0)
MCV: 93.5 fL (ref 78.0–100.0)
PLATELETS: 107 10*3/uL — AB (ref 150–400)
RBC: 2.61 MIL/uL — AB (ref 3.87–5.11)
RDW: 16.4 % — ABNORMAL HIGH (ref 11.5–15.5)
WBC: 6.4 10*3/uL (ref 4.0–10.5)

## 2015-08-24 LAB — CARBOXYHEMOGLOBIN
CARBOXYHEMOGLOBIN: 2 % — AB (ref 0.5–1.5)
Methemoglobin: 0.7 % (ref 0.0–1.5)
O2 SAT: 79.9 %
TOTAL HEMOGLOBIN: 7.1 g/dL — AB (ref 12.0–16.0)

## 2015-08-24 LAB — HEPARIN LEVEL (UNFRACTIONATED): Heparin Unfractionated: 0.3 IU/mL (ref 0.30–0.70)

## 2015-08-24 MED ORDER — FENTANYL CITRATE (PF) 100 MCG/2ML IJ SOLN
INTRAMUSCULAR | Status: AC
  Filled 2015-08-24: qty 2

## 2015-08-24 MED ORDER — SPIRONOLACTONE 25 MG PO TABS
12.5000 mg | ORAL_TABLET | Freq: Every day | ORAL | Status: DC
  Administered 2015-08-28 – 2015-08-31 (×4): 12.5 mg via ORAL
  Filled 2015-08-24 (×6): qty 1

## 2015-08-24 MED ORDER — METOLAZONE 5 MG PO TABS
5.0000 mg | ORAL_TABLET | Freq: Once | ORAL | Status: AC
  Administered 2015-08-24: 5 mg via ORAL
  Filled 2015-08-24: qty 1

## 2015-08-24 MED ORDER — MIDAZOLAM HCL 2 MG/2ML IJ SOLN
3.0000 mg | Freq: Once | INTRAMUSCULAR | Status: AC
  Administered 2015-08-24: 1 mg via INTRAVENOUS

## 2015-08-24 MED ORDER — POTASSIUM CHLORIDE CRYS ER 20 MEQ PO TBCR
40.0000 meq | EXTENDED_RELEASE_TABLET | Freq: Once | ORAL | Status: AC
  Administered 2015-08-24: 40 meq via ORAL
  Filled 2015-08-24: qty 2

## 2015-08-24 MED ORDER — FENTANYL CITRATE (PF) 100 MCG/2ML IJ SOLN
50.0000 ug | Freq: Once | INTRAMUSCULAR | Status: AC
  Administered 2015-08-24: 50 ug via INTRAVENOUS

## 2015-08-24 MED ORDER — FUROSEMIDE 10 MG/ML IJ SOLN
80.0000 mg | Freq: Three times a day (TID) | INTRAMUSCULAR | Status: DC
  Administered 2015-08-24 – 2015-08-26 (×6): 80 mg via INTRAVENOUS
  Filled 2015-08-24 (×7): qty 8

## 2015-08-24 MED ORDER — MIDAZOLAM HCL 2 MG/2ML IJ SOLN
INTRAMUSCULAR | Status: AC
  Filled 2015-08-24: qty 4

## 2015-08-24 MED ORDER — POTASSIUM CHLORIDE CRYS ER 20 MEQ PO TBCR
40.0000 meq | EXTENDED_RELEASE_TABLET | Freq: Two times a day (BID) | ORAL | Status: DC
  Administered 2015-08-24 – 2015-08-25 (×3): 40 meq via ORAL
  Filled 2015-08-24 (×3): qty 2

## 2015-08-24 MED FILL — Nitroglycerin IV Soln 100 MCG/ML in D5W: INTRA_ARTERIAL | Qty: 10 | Status: AC

## 2015-08-24 NOTE — Progress Notes (Signed)
Patient found to have right groin PSA on ultrasound. Case discussed with Dr. Arbie CookeyEarly. Felt not to be a good candidate for oprative repair or injection due to very tenuous cardiorespiratory status. PSA with small neck but quite deep and with significant surrounding hematoma. Decision made to undertake attempt at manual compression at bedside.   Intravenous heparin stopped .Patient premedicated with versed and fentanyl in small incremental doses. She developed mild respiratory distress but improved with face mask.  Right femoral artery pseudoaneurysm localized with the hep of ultrasound technologist.   Manual pressure personally held to right groin hematoma and pseudoaneurysm for over an hour. Serial ultrasounds reviewed which finally demonstrated occlusion of right femoral PSA. Six hours bedrest ordered. I updated her daughter. Will repeat u/s in am.  Total CC time personally spent was 75 minutes not including time on rounds this am.  Venera Privott,MD 10:07 PM

## 2015-08-24 NOTE — Progress Notes (Addendum)
ANTICOAGULATION CONSULT NOTE - Follow Up Consult  Pharmacy Consult for heparin Indication: chest pain/ACS and atrial fibrillation  Allergies  Allergen Reactions  . Ciprofloxacin Other (See Comments)    dysuria  . Metolazone Nausea And Vomiting  . Penicillins Hives  . Prednisone Other (See Comments)    Thin skin   . Torsemide Other (See Comments)    dysuria  . Levofloxacin Other (See Comments)    unknown  . Statins Other (See Comments)    unknown  . Sulfa Antibiotics Other (See Comments)    unknown    Patient Measurements: Height: 5\' 2"  (157.5 cm) Weight: 169 lb 12.1 oz (77 kg) IBW/kg (Calculated) : 50.1 Heparin Dosing Weight: 66.7 kg  Vital Signs: Temp: 98.2 F (36.8 C) (10/31 0734) Temp Source: Axillary (10/31 0734) BP: 83/49 mmHg (10/31 0800) Pulse Rate: 71 (10/31 0800)  Labs:  Recent Labs  05/11/2015 1900 08/22/15 0553  08/22/15 0815 08/22/15 1950 08/23/15 0345 08/23/15 0430 08/24/15 0540 08/24/15 0541  HGB 9.8* 9.1*  --  9.0*  --   --  9.2* 8.1*  --   HCT 31.2* 27.9*  --  27.5*  --   --  28.4* 24.4*  --   PLT 150 111*  --  135*  --   --  145* 107*  --   LABPROT 16.7*  --   --   --   --   --   --   --   --   INR 1.34  --   --   --   --   --   --   --   --   HEPARINUNFRC  --   --   < > 0.69 0.52 0.56  --   --  0.30  CREATININE 1.38* 1.51*  --   --   --   --  1.58* 1.45*  --   < > = values in this interval not displayed.  Estimated Creatinine Clearance: 30.7 mL/min (by C-G formula based on Cr of 1.45).   Medications:  Infusions:  . amiodarone 30 mg/hr (08/24/15 0800)  . DOBUTamine 5 mcg/kg/min (08/24/15 0800)  . heparin 850 Units/hr (08/24/15 0800)    Assessment: [age] YOF admitted 12-Jun-2015 from [facility] for cardiogenic shock mgmt. Presented to OSH with vol overload, hypotension, & worsening renal function. Prev on warfarin for AFib stopped earlier this mo d/t supratherapeutic level to 7 and no longer on anticoagulation. Pt CHADS2VASc score of at  least 5. Cardiac cath on 10/28 showed severe 3-vessel disease with chronic occlusion of LM and prox RCA and profound cardiogenic shock w/ elevated filling pressures. Med mgmt initiated for CAD and pt started on inotrope for low output. Heparin drip for anticoagulation while determining long term plan. HL 0.56 on heparin drip 850 units/hr.  Has groin hematoma extending down R thigh -plan US today.  H/H low stable - follow.  Goal of Therapy:    Heparin level 0.3-0.7 units/ml Monitor platelets by anticoagulation protocol: Yes   Plan:  Continue heparin at 850 units/h Daily HL, CBC Monitor s/sx bleeding    Leota SauersLisa Dillen Belmontes Pharm.D. CPP, BCPS Clinical Pharmacist [phone] 08/24/2015 9:24 AM      Pm heparin drip off d/t groin aneurysm - pressure applied.  Follow up in am for restart heparin drip.    Leota SauersLisa Yelitza Reach Pharm.D. CPP, BCPS Clinical Pharmacist  [phone] 08/24/2015 10:20 PM

## 2015-08-24 NOTE — Progress Notes (Signed)
*  PRELIMINARY RESULTS* Vascular Ultrasound Limited Right Lower Extremity Arterial Duplex has been completed.  There is evidence of an avascular heterogenous area of the right groin measuring 5.0cm, suggestive of a hematoma. Posterior to this area, there is a vascularized heterogenous area with connection to the right common femoral artery, suggestive of a pseudoaneurysm. This area is 4.7cm from the surface of the skin, measures 4.5cm in its greatest diameter, and has a neck that is 1.8cm long and 0.3cm wide.  The right common femoral and proximal femoral arteries are patent with monophasic flow. The right proximal femoral vein is compressible without evidence of thrombosis.  Preliminary results discussed with Tonye BecketAmy Clegg, NP.  08/24/2015 10:56 AM Gertie FeyMichelle Antonin Meininger, RVT, RDCS, RDMS

## 2015-08-24 NOTE — Consult Note (Signed)
Patient name: Kathleen Higgins MRN: [id number] DOB: 1935/01/03 Sex: female   Referred by: Bensimhon  Reason for referral: R femoral false aneurysm  HISTORY OF PRESENT ILLNESS: The patient is very complex 80 year old female who underwent cardiac catheterization on 10/28. She has severe coronary artery disease and has undergone coronary bypass grafting in the past. Has chronic congestive heart failure well with EF in the 10-15% range. She was complaining of some soreness in her right groin in a duplex this morning showed false aneurysm. She has no prior history of peripheral arterial disease. Does have a history of small abdominal aortic aneurysm also has a history of chronic kidney disease.  Past Medical History  Diagnosis Date  . CAD (coronary artery disease)     a. s/p CABG 2003. b. s/p stenting x 2 in 2014  . Hypothyroidism   . PVD (peripheral vascular disease) (HCC)   . AAA (abdominal aortic aneurysm) (HCC)   . Carotid artery disease (HCC)     a. s/p RICA atherectomy in 2003.  Marland Kitchen Essential hypertension   . Hyperlipidemia   . PAF (paroxysmal atrial fibrillation) (HCC)     a. Per d/c summary from 07/2015 [facility]).  . Chronic systolic CHF (congestive heart failure) (HCC)     a. Dx 07/2015 - EF 10-15%.  . CKD (chronic kidney disease)     a. Per d/c summary from 07/2015 [facility]) - baseline Cr not known.  Marland Kitchen COPD (chronic obstructive pulmonary disease) (HCC)   . Hyperglycemia   . Transaminitis     a. Per d/c summary from 07/2015 [facility]) - also had during later admission to [facility].   . Hyponatremia   . NSVT (nonsustained ventricular tachycardia) (HCC)     a. During 08/16/2015 admission at [facility].  . GERD (gastroesophageal reflux disease)     Past Surgical History  Procedure Laterality Date  . Coronary artery bypass graft    . Cardiac catheterization    . Cholecystectomy    . Cardiac  catheterization N/A 07/31/2015    Procedure: Right/Left Heart Cath and Coronary/Graft Angiography;  Surgeon: Dolores Patty, MD;  [facility] INVASIVE CV LAB;  Service: Cardiovascular;  Laterality: N/A;    Social History   Social History  . Marital Status: Married    Spouse Name: N/A  . Number of Children: N/A  . Years of Education: N/A   Occupational History  . Not on file.   Social History Main Topics  . Smoking status: Never Smoker   . Smokeless tobacco: Never Used  . Alcohol Use: No  . Drug Use: No  . Sexual Activity: Not on file   Other Topics Concern  . Not on file   Social History Narrative    History reviewed. No pertinent family history.  Allergies as of 07/29/2015 - Review Complete 08/05/2015  Allergen Reaction Noted  . Ciprofloxacin Other (See Comments) 08/06/2015  . Metolazone Nausea And Vomiting 07/28/2015  . Penicillins Hives 08/06/2015  . Prednisone Other (See Comments) 08/01/2015  . Torsemide Other (See Comments) 08/15/2015  . Levofloxacin Other (See Comments) 08/10/2015  . Statins Other (See Comments) 08/18/2015  . Sulfa antibiotics Other (See Comments) 07/29/2015    No current facility-administered medications on file prior to encounter.   No current outpatient prescriptions on file prior to encounter.     REVIEW OF SYSTEMS:  Reviewed in her H&P with nothing to add  PHYSICAL EXAMINATION:  General: The patient is  a well-nourished female, in no acute distress. Vital signs are BP 117/66 mmHg  Pulse 77  Temp(Src) 97.8 F (36.6 C) (Oral)  Resp 19  Ht 5\' 2"  (1.575 m)  Wt 169 lb 12.1 oz (77 kg)  BMI 31.04 kg/m2  SpO2 100% Pulmonary: There is a good air exchange  Abdomen: Soft and non-tender Musculoskeletal: There are no major deformities.   Neurologic: No focal weakness or paresthesias are detected, Skin: There are no ulcer or rashes noted. Psychiatric: The patient has normal affect. Cardiovascular: Palpable popliteal pulses  bilaterally. Absent pedal pulses bilaterally Right groin is noted for an expansile mass. Slight tenderness. Marked bruising over her anterior right thigh.     Vascular lab: Reviewed her study. This does show a 3 mm diameter neck and a 1-1/2 cm length for her neck is a false aneurysm. She does have some thrombus anterior to the pseudoaneurysm   Impression and Plan:  Discussed with Dr.Bensimhon. Does have a long thin neck which would be a candidate for depression. Concerned about any attempt at operative intervention with her severe coronary status. She will undergo compression later today for commencement present with the patient currently in the intensive care unit for sedation to make this tolerable for the patient. We will follow and will be available for surgical intervention if required    Govanni Plemons [facility] Office: [phone]

## 2015-08-24 NOTE — Progress Notes (Signed)
Inpatient Diabetes Program Recommendations  AACE/ADA: New Consensus Statement on Inpatient Glycemic Control (2015)  Target Ranges:  Prepandial:   less than 140 mg/dL      Peak postprandial:   less than 180 mg/dL (1-2 hours)      Critically ill patients:  140 - 180 mg/dL   Review of Glycemic Control  Inpatient Diabetes Program Recommendations:  HgbA1C: =6.5 will need follow-up with PCP after discharge  Thank you  Piedad ClimesGina Kenisha Lynds BSN, RN,CDE Inpatient Diabetes Coordinator [phone] (team pager)

## 2015-08-24 NOTE — Progress Notes (Signed)
*  PRELIMINARY RESULTS* Vascular Ultrasound Right common femoral artery pseudoaneurysm compression has been completed.  After 75 minutes of compression with Dr. Gala RomneyBensimhon, the pseudoaneurysm does not exhibit any internal flow by color flow doppler. The right femoral artery remains patent with monophasic flow.  08/24/2015 3:27 PM Gertie FeyMichelle Chinonso Linker, RVT, RDCS, RDMS

## 2015-08-24 NOTE — Progress Notes (Signed)
R groin bruit   Received call regarding R groin ultrasound from vascular lab. R groin hematoma and pseudoaneurysm identified.    [facility] called for consult.   Kemp Gomes NP-C  11:02 AM

## 2015-08-24 NOTE — Progress Notes (Signed)
Advanced Heart Failure Rounding Note   Subjective:    RHC 10/28 with marked volume overload and low CO. Started on milrinone. Co-ox improved 34 -> 60%. However this am back down to 34%. Milrinone increased to 0.375 but  stopped due to low CO-OX. Switched to dobutamine 5 mcg. CO-OX improved.   R groin soft bruit noted. R extremity ecchymotic.   Complaining of dyspnea at rest.    Had groin bleed after sheath pull. Hgb stable 9.8-> 9.1->9.2-->8.1    Objective:   Weight Range:  Vital Signs:   Temp:  [97.4 F (36.3 C)-98.2 F (36.8 C)] 98.2 F (36.8 C) (10/31 0734) Pulse Rate:  [71-92] 71 (10/31 0800) Resp:  [13-27] 22 (10/31 0800) BP: (83-118)/(49-89) 83/49 mmHg (10/31 0800) SpO2:  [98 %-100 %] 99 % (10/31 0800) Weight:  [169 lb 12.1 oz (77 kg)] 169 lb 12.1 oz (77 kg) (10/31 0500) Last BM Date: 08/23/15  Weight change: Filed Weights   08/22/15 0530 08/23/15 0237 08/24/15 0500  Weight: 167 lb 12.3 oz (76.1 kg) 171 lb 11.8 oz (77.9 kg) 169 lb 12.1 oz (77 kg)    Intake/Output:   Intake/Output Summary (Last 24 hours) at 08/24/15 0900 Last data filed at 08/24/15 0800  Gross per 24 hour  Intake    963 ml  Output   3850 ml  Net  -2887 ml     Physical Exam: CVP 12 General: Chronically ill-appearing. No respiratory difficulty. In bed.  HEENT: normal Neck: supple. JVP to jaw. R CEA scar Carotids 2+ bilat; no bruits. No lymphadenopathy or thryomegaly appreciated. Cor: PMI laterally displaced. Irregular rate & rhythm. No rubs, gallops or murmurs. Lungs: clear with decreased BS throughout Abdomen: soft, nontender, nondistended. No hepatosplenomegaly. No bruits or masses. Good bowel sounds. Extremities: no cyanosis, clubbing, rash, 1+ edema large r groin ecchymosis. Soft bruit noted R groin. Marland Kitchen  Neuro: alert & oriented x 3, cranial nerves grossly intact. moves all 4 extremities w/o difficulty. Affect pleasant.    Telemetry: AF 80s  Labs: Basic Metabolic  Panel:  Recent Labs Lab 2015/09/15 1900 08/22/15 0553 08/23/15 0430 08/24/15 0540  NA 131* 126* 128* 125*  K 3.7 3.7 4.4 2.9*  CL 93* 84* 87* 81*  CO2 34*  GLUCOSE 185* 270* 151* 107*  BUN 18 21* 22* 20  CREATININE 1.38* 1.51* 1.58* 1.45*  CALCIUM 8.6* 8.1* 9.0 8.8*  MG 1.8  --  1.8  --     Liver Function Tests:  Recent Labs Lab 09/15/15 1900 08/22/15 0553  AST 37 32  ALT 86* 77*  ALKPHOS 68 62  BILITOT 1.2 0.9  PROT 5.0* 4.7*  ALBUMIN 2.7* 2.7*   No results for input(s): LIPASE, AMYLASE in the last 168 hours. No results for input(s): AMMONIA in the last 168 hours.  CBC:  Recent Labs Lab 09-15-15 1900 08/22/15 0553 08/22/15 0815 08/23/15 0430 08/24/15 0540  WBC 6.0 7.0 7.0 9.8 6.4  NEUTROABS 4.1  --   --   --   --   HGB 9.8* 9.1* 9.0* 9.2* 8.1*  HCT 31.2* 27.9* 27.5* 28.4* 24.4*  MCV 96.0 95.5 95.2 94.4 93.5  PLT 150 111* 135* 145* 107*    Cardiac Enzymes: No results for input(s): CKTOTAL, CKMB, CKMBINDEX, TROPONINI in the last 168 hours.  BNP: BNP (last 3 results)  Recent Labs  08/23/15 0650  BNP 1734.9*    ProBNP (last 3 results) No results for input(s): PROBNP in the last 8760  hours.    Other results:  Imaging: Dg Chest Port 1 View  08/23/2015  CLINICAL DATA:  Acute onset of shortness of breath. Initial encounter. EXAM: PORTABLE CHEST 1 VIEW COMPARISON:  Chest radiograph performed 08/08/2015 FINDINGS: The lungs are well-aerated. Vascular congestion is noted, with bilateral central airspace opacities and small bilateral pleural effusions, likely reflecting mild pulmonary edema, more prominent than on the prior study. There is no evidence of pneumothorax. The cardiomediastinal silhouette is mildly enlarged. The patient is status post median sternotomy. A right PICC is noted ending overlying the distal SVC. No acute osseous abnormalities are seen. IMPRESSION: Vascular congestion, with bilateral central airspace opacities and small  bilateral pleural effusions, likely reflecting mild pulmonary edema, more prominent than on the prior study. Mild cardiomegaly noted. Electronically Signed   By: Roanna RaiderJeffery  Chang M.D.   On: 08/23/2015 06:20     Medications:     Scheduled Medications: . antiseptic oral rinse  7 mL Mouth Rinse BID  . aspirin EC  81 mg Oral Daily  . furosemide  80 mg Intravenous BID  . levothyroxine  50 mcg Oral QAC breakfast  . pantoprazole  40 mg Oral Q0600  . polyethylene glycol  17 g Oral Daily  . potassium chloride  20 mEq Oral BID  . sodium chloride  3 mL Intravenous Q12H    Infusions: . amiodarone 30 mg/hr (08/24/15 0800)  . DOBUTamine 5 mcg/kg/min (08/24/15 0800)  . heparin 850 Units/hr (08/24/15 0800)    PRN Medications: sodium chloride, acetaminophen, levalbuterol, ondansetron (ZOFRAN) IV, sodium chloride, sorbitol   Assessment:   1. Cardiogenic shock 2. A/C systolic HF EF 10-15% 3. CAD s/p CABG 2003, PCI RCA 2014 4. PAF, onset 8/14  --coumadin stopped due to elevated INR 5. LBBB 6. PAD 7. COPD 8. A/C renal failure, stage 4  -h/o solitary kidney due to kidney stones.  9. VT 10. R groin hematoma 11. Hypokalemia  Plan/Discussion:    Remains quite tenuous. CO -OX today 80%. Check ultrasound now has soft R femoral bruit noted. Hemoglobin also dropping.   CVP ~12 . Increase 80 mg IV lasix twice a day + metolazone. Watch renal function closely. Supplement K.   Hold off on TEE DC-CV. On heparin. Has not tolerated coumadin in past due to labile INRs. Will use Eliquis once groin site completely stable.    Length of Stay: 3  Amy Clegg NP-C  08/24/2015, 9:00 AM  Advanced Heart Failure Team Pager [phone] (M-F; 7a - 4p)  Please contact [facility] for night-coverage after hours (4p -7a ) and weekends on [email or web address]  Patient seen and examined with Tonye BecketAmy Clegg, NP. We discussed all aspects of the encounter. I agree with the assessment and plan as stated above.   Hemodynamics  improved on switch from milrinone to dobutamine. Volume status still markedly elevated. Will diureses aggressively. Defer TEE and DC-CV a day or two until volume status better and hgb stable. Groin ecchymosis stable. There is soft femoral bruit. Will u/s site. Continue amio and heparin. Keep active T&S. Supp K+.    The patient is critically ill with multiple organ systems failure and requires high complexity decision making for assessment and support, frequent evaluation and titration of therapies, application of advanced monitoring technologies and extensive interpretation of multiple databases.   Critical Care Time devoted to patient care services described in this note is 35 Minutes.  Bensimhon, Daniel,MD 10:31 AM

## 2015-08-25 ENCOUNTER — Inpatient Hospital Stay (HOSPITAL_COMMUNITY): Payer: Medicare Other

## 2015-08-25 DIAGNOSIS — I729 Aneurysm of unspecified site: Secondary | ICD-10-CM

## 2015-08-25 LAB — BASIC METABOLIC PANEL
Anion gap: 11 (ref 5–15)
BUN: 20 mg/dL (ref 6–20)
CO2: 33 mmol/L — ABNORMAL HIGH (ref 22–32)
Calcium: 8.7 mg/dL — ABNORMAL LOW (ref 8.9–10.3)
Chloride: 76 mmol/L — ABNORMAL LOW (ref 101–111)
Creatinine, Ser: 1.49 mg/dL — ABNORMAL HIGH (ref 0.44–1.00)
GFR calc Af Amer: 38 mL/min — ABNORMAL LOW (ref >60)
GFR, EST NON AFRICAN AMERICAN: 32 mL/min — AB (ref >60)
GLUCOSE: 179 mg/dL — AB (ref 65–99)
POTASSIUM: 3.8 mmol/L (ref 3.5–5.1)
Sodium: 120 mmol/L — ABNORMAL LOW (ref 135–145)

## 2015-08-25 LAB — CBC
HCT: 25.9 % — ABNORMAL LOW (ref 36.0–46.0)
Hemoglobin: 8.3 g/dL — ABNORMAL LOW (ref 12.0–15.0)
MCH: 30.1 pg (ref 26.0–34.0)
MCHC: 32 g/dL (ref 30.0–36.0)
MCV: 93.8 fL (ref 78.0–100.0)
PLATELETS: 116 10*3/uL — AB (ref 150–400)
RBC: 2.76 MIL/uL — ABNORMAL LOW (ref 3.87–5.11)
RDW: 16.6 % — AB (ref 11.5–15.5)
WBC: 6.3 10*3/uL (ref 4.0–10.5)

## 2015-08-25 LAB — CARBOXYHEMOGLOBIN
Carboxyhemoglobin: 1.6 % — ABNORMAL HIGH (ref 0.5–1.5)
Carboxyhemoglobin: 1.3 % (ref 0.5–1.5)
METHEMOGLOBIN: 0.8 % (ref 0.0–1.5)
Methemoglobin: 0.7 % (ref 0.0–1.5)
O2 Saturation: 55.1 %
O2 Saturation: 45.3 %
Total hemoglobin: 8.4 g/dL — ABNORMAL LOW (ref 12.0–16.0)
Total hemoglobin: 8.2 g/dL — ABNORMAL LOW (ref 12.0–16.0)

## 2015-08-25 MED ORDER — PROMETHAZINE HCL 25 MG/ML IJ SOLN
12.5000 mg | Freq: Once | INTRAMUSCULAR | Status: AC
  Administered 2015-08-25: 12.5 mg via INTRAVENOUS
  Filled 2015-08-25: qty 1

## 2015-08-25 MED ORDER — FENTANYL CITRATE (PF) 100 MCG/2ML IJ SOLN
25.0000 ug | Freq: Once | INTRAMUSCULAR | Status: AC
  Administered 2015-08-25: 25 ug via INTRAVENOUS
  Filled 2015-08-25: qty 2

## 2015-08-25 MED ORDER — METOLAZONE 5 MG PO TABS
5.0000 mg | ORAL_TABLET | Freq: Once | ORAL | Status: AC
  Administered 2015-08-25: 5 mg via ORAL
  Filled 2015-08-25: qty 1

## 2015-08-25 MED ORDER — FENTANYL CITRATE (PF) 100 MCG/2ML IJ SOLN
25.0000 ug | INTRAMUSCULAR | Status: DC | PRN
  Administered 2015-08-25 – 2015-08-29 (×5): 25 ug via INTRAVENOUS
  Filled 2015-08-25 (×5): qty 2

## 2015-08-25 MED ORDER — POTASSIUM CHLORIDE CRYS ER 10 MEQ PO TBCR
40.0000 meq | EXTENDED_RELEASE_TABLET | Freq: Two times a day (BID) | ORAL | Status: DC
  Administered 2015-08-25 – 2015-08-30 (×10): 40 meq via ORAL
  Filled 2015-08-25 (×12): qty 4

## 2015-08-25 NOTE — Progress Notes (Signed)
Advanced Heart Failure Rounding Note   Subjective:    RHC 10/28 with marked volume overload and low CO. Started on milrinone. Co-ox improved 34 -> 60%. However this am back down to 34%. Milrinone increased to 0.375 but  stopped due to low CO-OX. Switched to dobutamine 5 mcg. CO-OX improved.   Continues to feel weak. Breathing better. However CVP remains 20. R groin pseudoaneurysm compressed successfully yesterday but this am patient developed another small groin hematoma.   Weight down 4 pounds with IV diuresis. Remains on dobutamine 5. Co-ox 55% -> 45%  hgb stable at 8.4   Objective:   Weight Range:  Vital Signs:   Temp:  [97.8 F (36.6 C)-98.2 F (36.8 C)] 97.9 F (36.6 C) (11/01 0400) Pulse Rate:  [68-87] 77 (11/01 0400) Resp:  [10-22] 12 (11/01 0400) BP: (83-136)/(46-117) 103/55 mmHg (11/01 0400) SpO2:  [87 %-100 %] 100 % (11/01 0400) Weight:  [74.9 kg (165 lb 2 oz)] 74.9 kg (165 lb 2 oz) (11/01 0326) Last BM Date: 08/24/15  Weight change: Filed Weights   08/23/15 0237 08/24/15 0500 08/25/15 0326  Weight: 77.9 kg (171 lb 11.8 oz) 77 kg (169 lb 12.1 oz) 74.9 kg (165 lb 2 oz)    Intake/Output:   Intake/Output Summary (Last 24 hours) at 08/25/15 0502 Last data filed at 08/25/15 0400  Gross per 24 hour  Intake  688.5 ml  Output   3300 ml  Net -2611.5 ml     Physical Exam: CVP 12 General: Chronically ill-appearing. No respiratory difficulty. In bed.  HEENT: normal Neck: supple. JVP to jaw. R CEA scar Carotids 2+ bilat; no bruits. No lymphadenopathy or thryomegaly appreciated. Cor: PMI laterally displaced. Irregular rate & rhythm. No rubs, gallops or murmurs. Lungs: clear with decreased BS throughout Abdomen: soft, nontender, nondistended. No hepatosplenomegaly. No bruits or masses. Good bowel sounds. Extremities: no cyanosis, clubbing, rash, 1+ edema large r groin ecchymosis. Bruit resolved. Neuro: alert & oriented x 3, cranial nerves grossly intact. moves  all 4 extremities w/o difficulty. Affect pleasant.    Telemetry: AF 80s  Labs: Basic Metabolic Panel:  Recent Labs Lab 2015-08-11 1900 08/22/15 0553 08/23/15 0430 08/24/15 0540 08/24/15 1725 08/25/15 0326  NA 131* 126* 128* 125* 122* 120*  K 3.7 3.7 4.4 2.9* 3.8 3.8  CL 93* 84* 87* 81* 78* 76*  CO2 28 28 29  34* 32 33*  GLUCOSE 185* 270* 151* 107* 144* 179*  BUN 18 21* 22* 20 18 20   CREATININE 1.38* 1.51* 1.58* 1.45* 1.34* 1.49*  CALCIUM 8.6* 8.1* 9.0 8.8* 9.0 8.7*  MG 1.8  --  1.8  --   --   --     Liver Function Tests:  Recent Labs Lab 2015-08-11 1900 08/22/15 0553  AST 37 32  ALT 86* 77*  ALKPHOS 68 62  BILITOT 1.2 0.9  PROT 5.0* 4.7*  ALBUMIN 2.7* 2.7*   No results for input(s): LIPASE, AMYLASE in the last 168 hours. No results for input(s): AMMONIA in the last 168 hours.  CBC:  Recent Labs Lab 2015-08-11 1900 08/22/15 0553 08/22/15 0815 08/23/15 0430 08/24/15 0540 08/25/15 0326  WBC 6.0 7.0 7.0 9.8 6.4 6.3  NEUTROABS 4.1  --   --   --   --   --   HGB 9.8* 9.1* 9.0* 9.2* 8.1* 8.3*  HCT 31.2* 27.9* 27.5* 28.4* 24.4* 25.9*  MCV 96.0 95.5 95.2 94.4 93.5 93.8  PLT 150 111* 135* 145* 107* 116*  Cardiac Enzymes: No results for input(s): CKTOTAL, CKMB, CKMBINDEX, TROPONINI in the last 168 hours.  BNP: BNP (last 3 results)  Recent Labs  08/23/15 0650  BNP 1734.9*    ProBNP (last 3 results) No results for input(s): PROBNP in the last 8760 hours.    Other results:  Imaging: Dg Chest Port 1 View  08/23/2015  CLINICAL DATA:  Acute onset of shortness of breath. Initial encounter. EXAM: PORTABLE CHEST 1 VIEW COMPARISON:  Chest radiograph performed 08/03/2015 FINDINGS: The lungs are well-aerated. Vascular congestion is noted, with bilateral central airspace opacities and small bilateral pleural effusions, likely reflecting mild pulmonary edema, more prominent than on the prior study. There is no evidence of pneumothorax. The cardiomediastinal  silhouette is mildly enlarged. The patient is status post median sternotomy. A right PICC is noted ending overlying the distal SVC. No acute osseous abnormalities are seen. IMPRESSION: Vascular congestion, with bilateral central airspace opacities and small bilateral pleural effusions, likely reflecting mild pulmonary edema, more prominent than on the prior study. Mild cardiomegaly noted. Electronically Signed   By: Roanna Raider M.D.   On: 08/23/2015 06:20     Medications:     Scheduled Medications: . antiseptic oral rinse  7 mL Mouth Rinse BID  . aspirin EC  81 mg Oral Daily  . furosemide  80 mg Intravenous 3 times per day  . levothyroxine  50 mcg Oral QAC breakfast  . pantoprazole  40 mg Oral Q0600  . polyethylene glycol  17 g Oral Daily  . potassium chloride  40 mEq Oral BID  . sodium chloride  3 mL Intravenous Q12H  . spironolactone  12.5 mg Oral Daily    Infusions: . amiodarone 30 mg/hr (08/24/15 2206)  . DOBUTamine 5 mcg/kg/min (08/24/15 2206)    PRN Medications: sodium chloride, acetaminophen, fentaNYL (SUBLIMAZE) injection, levalbuterol, ondansetron (ZOFRAN) IV, sodium chloride, sorbitol   Assessment:   1. Cardiogenic shock 2. A/C systolic HF EF 10-15% 3. CAD s/p CABG 2003, PCI RCA 2014 4. PAF, onset 8/14  --coumadin stopped due to elevated INR 5. LBBB 6. PAD 7. COPD 8. A/C renal failure, stage 4  -h/o solitary kidney due to kidney stones.  9. VT 10. R groin hematoma/PSA 11. Hypokalemia  Plan/Discussion:    Remains quite tenuous. CO -OX today back down to ~50% despite dobutamine 5. CVP 20. Options getting increasingly limited. Will recheck R groin u/s ultrasound to re-evaluate R femoral artery site - bruit no longer present so sustpect it will be ok.. Once stable and we can safely restart anti-coagulation will consider TEE/DC-CV later in week. Continue amio.   Recheck CBC later today.  CVP 12-> 20 overnight despite increasing lasix. Continue 80 mg IV  lasix twice a day + metolazone. Watch renal function closely. Supplement K.   Has not tolerated coumadin in past due to labile INRs. Will use Eliquis once groin site completely stable.    The patient is critically ill with multiple organ systems failure and requires high complexity decision making for assessment and support, frequent evaluation and titration of therapies, application of advanced monitoring technologies and extensive interpretation of multiple databases.   Critical Care Time devoted to patient care services described in this note is 35 Minutes.  Length of Stay: 4  Arvilla Meres MD  08/25/2015, 5:02 AM  Advanced Heart Failure Team Pager [phone] (M-F; 7a - 4p)  Please contact [facility] for night-coverage after hours (4p -7a ) and weekends on [email or web address]

## 2015-08-25 NOTE — Progress Notes (Signed)
After patient's bedrest was complete, patient got up with assistance to chair. Patient developed a hematoma on right groin. Pressure was held for 20 min and site became soft. Vital signs were stable. Patient became nauseated and had pain at the site while holding pressure. Patient back in bed and site is soft. Will continue to monitor.

## 2015-08-25 NOTE — Progress Notes (Signed)
Paged for persistent nausea despite zofran 4 mg q6prn.    Likely from hypoperfusion.   Spoke with pharm and will try one dose of 12.5 mg phenergan and see if improves.   Casimiro NeedleMichael [address]Andy" [place], PA-C 08/25/2015 1:55 PM

## 2015-08-25 NOTE — Progress Notes (Signed)
*  PRELIMINARY RESULTS* Vascular Ultrasound Limited Right Lower Extremity Arterial Duplex has been completed.  There is no evidence of pseudoaneurysm of the right groin.   08/25/2015 9:19 AM Gertie FeyMichelle Marlon Suleiman, RVT, RDCS, RDMS

## 2015-08-25 NOTE — Progress Notes (Signed)
Physical Therapy Treatment Patient Details Name: Kathleen Higgins MRN: [id number] DOB: 05-Oct-1935 Today's Date: 08/25/2015    History of Present Illness 80 y/o woman Admitted to [facility] several days ago with volume overload, hypotension and worsening renal function  with h/o CAD s/p CABG 2003, stent RCA 2014 ([place]), systolic HF EF ~15%, AF (onset 8/16), carotid stenosis (s/p R CEA 2003), CRI with solitary kidney (lost one due to stones), AAA transferred from [facility] for further management of cardiogenic shock. Cardiac cath with bleeding/hematoma at thigh post sheath pull    PT Comments    Despite N/V today, with encouragement, pt able to push herself to mobilize.  Emphasis most on gait stability and tolerance today.  Follow Up Recommendations  SNF     Equipment Recommendations  Rolling walker with 5" wheels;3in1 (PT)    Recommendations for Other Services       Precautions / Restrictions Precautions Precautions: Fall    Mobility  Bed Mobility               General bed mobility comments: already in the chair  Transfers Overall transfer level: Needs assistance Equipment used: Rolling walker (2 wheeled) Transfers: Sit to/from Stand Sit to Stand: Min assist         General transfer comment: cued for hand placement, but pt still pulled up on the RW  Ambulation/Gait Ambulation/Gait assistance: Min assist Ambulation Distance (Feet): 50 Feet Assistive device: Rolling walker (2 wheeled) Gait Pattern/deviations: Step-through pattern Gait velocity: slowed   General Gait Details: Min steady assist. Gait mildly unsteady, but safe with minor assist.  Chair behind pt for increasing pt's confidence to do more.   Stairs            Wheelchair Mobility    Modified Rankin (Stroke Patients Only)       Balance Overall balance assessment: Needs assistance Sitting-balance support: No upper extremity supported Sitting balance-Leahy Scale: Fair     Standing balance support: Bilateral upper extremity supported Standing balance-Leahy Scale: Poor Standing balance comment: Reliant on the RW                    Cognition Arousal/Alertness: Awake/alert Behavior During Therapy: WFL for tasks assessed/performed;Flat affect Overall Cognitive Status: Within Functional Limits for tasks assessed                      Exercises      General Comments General comments (skin integrity, edema, etc.): VSS throughout      Pertinent Vitals/Pain Pain Assessment: Faces Faces Pain Scale: Hurts little more Pain Location: R leg Pain Descriptors / Indicators: Grimacing Pain Intervention(s): Monitored during session;Limited activity within patient's tolerance    Home Living                      Prior Function            PT Goals (current goals can now be found in the care plan section) Acute Rehab PT Goals Patient Stated Goal: I want to get my strength back PT Goal Formulation: With patient Time For Goal Achievement: 09/06/15 Potential to Achieve Goals: Good Progress towards PT goals: Progressing toward goals    Frequency  Min 3X/week    PT Plan Current plan remains appropriate    Co-evaluation             End of Session Equipment Utilized During Treatment: Oxygen Activity Tolerance: Patient tolerated treatment well;Patient limited by pain  Patient left: in chair;with call bell/phone within reach;with family/visitor present     Time: [id number] PT Time Calculation (min) (ACUTE ONLY): 24 min  Charges:  $Gait Training: 8-22 mins $Therapeutic Activity: 8-22 mins                    G Codes:      Tausha Milhoan, Eliseo GumKenneth V 08/25/2015, 3:25 PM  08/25/2015  Florence BingKen Leslie Jester, PT [phone]  (pager)

## 2015-08-25 NOTE — Progress Notes (Signed)
  [facility] Progress Note  Subjective  - Feeling nauseous this morning.  Having moderate pain at right groin.    Objective Filed Vitals:   08/25/15 0700  BP: 105/63  Pulse: 78  Temp: 97.7 F (36.5 C)  Resp: 12    Intake/Output Summary (Last 24 hours) at 08/25/15 0845 Last data filed at 08/25/15 0800  Gross per 24 hour  Intake  685.5 ml  Output   3500 ml  Net -2814.5 ml   Right groin small hematoma.  Ecchymosis of right thigh. Right foot warm.   Assessment/Planning: 80 y.o. female with right groin pseudoaneurysm following cardiac cath.   Compression successful yesterday but patient redeveloped a small hematoma last night. Follow-up ultrasound this morning.  Continue to hold heparin. Will continue to follow.    Raymond GurneyKimberly A Atticus Lemberger 08/25/2015 8:45 AM --  Laboratory CBC    Component Value Date/Time   WBC 6.3 08/25/2015 0326   HGB 8.3* 08/25/2015 0326   HCT 25.9* 08/25/2015 0326   PLT 116* 08/25/2015 0326    BMET    Component Value Date/Time   NA 120* 08/25/2015 0326   K 3.8 08/25/2015 0326   CL 76* 08/25/2015 0326   CO2 33* 08/25/2015 0326   GLUCOSE 179* 08/25/2015 0326   BUN 20 08/25/2015 0326   CREATININE 1.49* 08/25/2015 0326   CALCIUM 8.7* 08/25/2015 0326   GFRNONAA 32* 08/25/2015 0326   GFRAA 38* 08/25/2015 0326    COAG Lab Results  Component Value Date   INR 1.34 11/29/14   No results found for: PTT  Antibiotics Anti-infectives    None       Maris BergerKimberly Latoyna Hird, PA-C [facility] Office: [phone] Pager: [phone] 08/25/2015 8:45 AM

## 2015-08-25 NOTE — Progress Notes (Signed)
At 2120 RN in room and pt stating it felt harder to breath . RN had just given PRN Fentanyl for back pain, PRN Zofran because pt feeling nauseas, and scheduled lasix. Pt clear but diminished throughout lungs. Respiratory rate, heart rate, and O2 stat, and BP remain stable throughout pt feeling short of breath. Respiratory called to give PRN breathing treatment. Pt repositioned in bed. Post PRN breathing treatment pt states it is "a little easier" but she "does not understand why she feels short of breath." RN will continue to monitor.

## 2015-08-25 NOTE — Progress Notes (Signed)
Patient developed a hematoma at right groin site. Pressure held for 20 minutes. Patient experienced pain while pressure held and 25 mcg fentanyl was given. Vital signs stable. Hematoma decreased, site soft. Will continue to monitor.

## 2015-08-25 NOTE — Progress Notes (Signed)
Patient developed a hematoma at right groin site. Pressure was held for 20 minutes until site was soft. Patient experienced pain while pressure was held, 25 mcg of fentanyl was give. Vital signs were stable. Will continue to monitor.

## 2015-08-25 DEATH — deceased

## 2015-08-26 ENCOUNTER — Inpatient Hospital Stay (HOSPITAL_COMMUNITY): Payer: Medicare Other

## 2015-08-26 DIAGNOSIS — I509 Heart failure, unspecified: Secondary | ICD-10-CM

## 2015-08-26 LAB — BASIC METABOLIC PANEL
Anion gap: 12 (ref 5–15)
BUN: 23 mg/dL — AB (ref 6–20)
CHLORIDE: 75 mmol/L — AB (ref 101–111)
CO2: 34 mmol/L — ABNORMAL HIGH (ref 22–32)
Calcium: 9.1 mg/dL (ref 8.9–10.3)
Creatinine, Ser: 1.65 mg/dL — ABNORMAL HIGH (ref 0.44–1.00)
GFR calc Af Amer: 33 mL/min — ABNORMAL LOW (ref >60)
GFR, EST NON AFRICAN AMERICAN: 29 mL/min — AB (ref >60)
GLUCOSE: 190 mg/dL — AB (ref 65–99)
POTASSIUM: 4.2 mmol/L (ref 3.5–5.1)
Sodium: 121 mmol/L — ABNORMAL LOW (ref 135–145)

## 2015-08-26 LAB — CARBOXYHEMOGLOBIN
CARBOXYHEMOGLOBIN: 1.7 % — AB (ref 0.5–1.5)
Carboxyhemoglobin: 1.5 % (ref 0.5–1.5)
Carboxyhemoglobin: 1.2 % (ref 0.5–1.5)
Carboxyhemoglobin: 1.4 % (ref 0.5–1.5)
METHEMOGLOBIN: 0.8 % (ref 0.0–1.5)
METHEMOGLOBIN: 0.7 % (ref 0.0–1.5)
Methemoglobin: 0.7 % (ref 0.0–1.5)
Methemoglobin: 0.8 % (ref 0.0–1.5)
O2 SAT: 40.1 %
O2 Saturation: 38 %
O2 Saturation: 49.6 %
O2 Saturation: 42.2 %
TOTAL HEMOGLOBIN: 8.7 g/dL — AB (ref 12.0–16.0)
TOTAL HEMOGLOBIN: 8.5 g/dL — AB (ref 12.0–16.0)
Total hemoglobin: 11.6 g/dL — ABNORMAL LOW (ref 12.0–16.0)
Total hemoglobin: 8.9 g/dL — ABNORMAL LOW (ref 12.0–16.0)

## 2015-08-26 LAB — CBC
HCT: 25.8 % — ABNORMAL LOW (ref 36.0–46.0)
Hemoglobin: 8.5 g/dL — ABNORMAL LOW (ref 12.0–15.0)
MCH: 30.6 pg (ref 26.0–34.0)
MCHC: 32.9 g/dL (ref 30.0–36.0)
MCV: 92.8 fL (ref 78.0–100.0)
PLATELETS: 130 10*3/uL — AB (ref 150–400)
RBC: 2.78 MIL/uL — AB (ref 3.87–5.11)
RDW: 16.4 % — ABNORMAL HIGH (ref 11.5–15.5)
WBC: 6.7 10*3/uL (ref 4.0–10.5)

## 2015-08-26 LAB — HEPARIN LEVEL (UNFRACTIONATED): Heparin Unfractionated: 0.11 IU/mL — ABNORMAL LOW (ref 0.30–0.70)

## 2015-08-26 MED ORDER — METOLAZONE 5 MG PO TABS
5.0000 mg | ORAL_TABLET | Freq: Every day | ORAL | Status: DC
  Administered 2015-08-26: 5 mg via ORAL
  Filled 2015-08-26: qty 1

## 2015-08-26 MED ORDER — FUROSEMIDE 10 MG/ML IJ SOLN
10.0000 mg/h | INTRAVENOUS | Status: DC
  Administered 2015-08-26 – 2015-08-27 (×2): 15 mg/h via INTRAVENOUS
  Administered 2015-08-28: 5 mg/h via INTRAVENOUS
  Administered 2015-08-29 – 2015-08-30 (×2): 10 mg/h via INTRAVENOUS
  Filled 2015-08-26 (×12): qty 25

## 2015-08-26 MED ORDER — NOREPINEPHRINE BITARTRATE 1 MG/ML IV SOLN
5.0000 ug/min | INTRAVENOUS | Status: DC
  Administered 2015-08-26: 5 ug/min via INTRAVENOUS
  Administered 2015-08-26: 10 ug/min via INTRAVENOUS
  Administered 2015-08-27: 5 ug/min via INTRAVENOUS
  Administered 2015-08-27: 10 ug/min via INTRAVENOUS
  Administered 2015-08-28: 5 ug/min via INTRAVENOUS
  Filled 2015-08-26 (×7): qty 4

## 2015-08-26 MED ORDER — HEPARIN (PORCINE) IN NACL 100-0.45 UNIT/ML-% IJ SOLN
1050.0000 [IU]/h | INTRAMUSCULAR | Status: DC
  Administered 2015-08-26: 850 [IU]/h via INTRAVENOUS
  Filled 2015-08-26: qty 250

## 2015-08-26 MED ORDER — NOREPINEPHRINE BITARTRATE 1 MG/ML IV SOLN
5.0000 ug/min | INTRAVENOUS | Status: DC
  Filled 2015-08-26: qty 4

## 2015-08-26 MED ORDER — LEVOTHYROXINE SODIUM 50 MCG PO TABS
50.0000 ug | ORAL_TABLET | Freq: Every day | ORAL | Status: DC
  Administered 2015-08-27 – 2015-08-31 (×5): 50 ug via ORAL
  Filled 2015-08-26 (×5): qty 1

## 2015-08-26 NOTE — Progress Notes (Signed)
ANTICOAGULATION CONSULT NOTE - Follow Up Consult  Pharmacy Consult for heparin Indication: chest pain/ACS and atrial fibrillation  Allergies  Allergen Reactions  . Ciprofloxacin Other (See Comments)    dysuria  . Metolazone Nausea And Vomiting  . Penicillins Hives  . Prednisone Other (See Comments)    Thin skin   . Torsemide Other (See Comments)    dysuria  . Levofloxacin Other (See Comments)    unknown  . Statins Other (See Comments)    unknown  . Sulfa Antibiotics Other (See Comments)    unknown    Patient Measurements: Height: 5\' 2"  (157.5 cm) Weight: 166 lb 1.6 oz (75.342 kg) IBW/kg (Calculated) : 50.1 Heparin Dosing Weight: 66.7 kg  Vital Signs: Temp: 97.5 F (36.4 C) (11/02 2000) Temp Source: Oral (11/02 2000) BP: 125/79 mmHg (11/02 2300) Pulse Rate: 74 (11/02 2300)  Labs:  Recent Labs  08/24/15 0540 08/24/15 0541 08/24/15 1725 08/25/15 0326 08/26/15 0330 08/26/15 2155  HGB 8.1*  --   --  8.3* 8.5*  --   HCT 24.4*  --   --  25.9* 25.8*  --   PLT 107*  --   --  116* 130*  --   HEPARINUNFRC  --  0.30  --   --   --  0.11*  CREATININE 1.45*  --  1.34* 1.49* 1.65*  --     Estimated Creatinine Clearance: 26.7 mL/min (by C-G formula based on Cr of 1.65).   Medications:  Infusions:  . amiodarone 30 mg/hr (08/26/15 2206)  . DOBUTamine 5 mcg/kg/min (08/26/15 0800)  . furosemide (LASIX) infusion 15 mg/hr (08/26/15 1204)  . heparin 850 Units/hr (08/26/15 1716)  . norepinephrine (LEVOPHED) Adult infusion 10 mcg/min (08/26/15 2055)    Assessment: [age] YOF admitted 08/18/2015 from [facility] for cardiogenic shock mgmt. Presented to OSH with vol overload, hypotension, & worsening renal function. Prev on warfarin for AFib stopped earlier this mo d/t supratherapeutic level to 7 and no longer on anticoagulation. Pt CHADS2VASc score of at least 5. Cardiac cath on 10/28 showed severe 3-vessel disease with chronic occlusion of LM and prox RCA and profound cardiogenic  shock w/ elevated filling pressures. Med mgmt initiated for CAD and pt started on inotrope for low output. Heparin drip started for anticoagulation while determining long term plan. Heparin stopped 10/31 for groin hematoma but improved so restarted 11/2. Heparin level down to 0.11 (subtherapeutic) on 850 units/hr. No issues with line or bleeding reported per RN.  Goal of Therapy:    Heparin level 0.3-0.7 units/ml Monitor platelets by anticoagulation protocol: Yes   Plan:  Increase heparin to 1050 units/h Check HL in 8hr   Christoper Fabianaron Kail Fraley, PharmD, BCPS Clinical pharmacist, pager [phone] 08/26/2015 11:53 PM

## 2015-08-26 NOTE — Progress Notes (Signed)
ANTICOAGULATION CONSULT NOTE - Follow Up Consult  Pharmacy Consult for heparin Indication: chest pain/ACS and atrial fibrillation  Allergies  Allergen Reactions  . Ciprofloxacin Other (See Comments)    dysuria  . Metolazone Nausea And Vomiting  . Penicillins Hives  . Prednisone Other (See Comments)    Thin skin   . Torsemide Other (See Comments)    dysuria  . Levofloxacin Other (See Comments)    unknown  . Statins Other (See Comments)    unknown  . Sulfa Antibiotics Other (See Comments)    unknown    Patient Measurements: Height: 5\' 2"  (157.5 cm) Weight: 166 lb 1.6 oz (75.342 kg) IBW/kg (Calculated) : 50.1 Heparin Dosing Weight: 66.7 kg  Vital Signs: Temp: 97.6 F (36.4 C) (11/02 1200) Temp Source: Oral (11/02 0800) BP: 110/87 mmHg (11/02 1300) Pulse Rate: 68 (11/02 1300)  Labs:  Recent Labs  08/24/15 0540 08/24/15 0541 08/24/15 1725 08/25/15 0326 08/26/15 0330  HGB 8.1*  --   --  8.3* 8.5*  HCT 24.4*  --   --  25.9* 25.8*  PLT 107*  --   --  116* 130*  HEPARINUNFRC  --  0.30  --   --   --   CREATININE 1.45*  --  1.34* 1.49* 1.65*    Estimated Creatinine Clearance: 26.7 mL/min (by C-G formula based on Cr of 1.65).   Medications:  Infusions:  . amiodarone 30 mg/hr (08/26/15 1154)  . DOBUTamine 5 mcg/kg/min (08/26/15 0800)  . furosemide (LASIX) infusion 15 mg/hr (08/26/15 1204)  . heparin    . norepinephrine (LEVOPHED) Adult infusion 5 mcg/min (08/26/15 0845)    Assessment: [age] YOF admitted 02-03-2015 from [facility] for cardiogenic shock mgmt. Presented to OSH with vol overload, hypotension, & worsening renal function. Prev on warfarin for AFib stopped earlier this mo d/t supratherapeutic level to 7 and no longer on anticoagulation. Pt CHADS2VASc score of at least 5. Cardiac cath on 10/28 showed severe 3-vessel disease with chronic occlusion of LM and prox RCA and profound cardiogenic shock w/ elevated filling pressures. Med mgmt initiated for CAD and pt  started on inotrope for low output. Heparin drip started for anticoagulation while determining long term plan. HL 0.56 on heparin drip 850 units/hr.  10/31 Has groin hematoma extending down R thigh - US showed aneurysm - heparin stopped and pressure held.    11/2 groin improved, hematoma stable, CBC stable  restart heparin drip 850 uts/hr    Goal of Therapy:    Heparin level 0.3-0.7 units/ml Monitor platelets by anticoagulation protocol: Yes   Plan:   heparin at 850 units/h Check HL 6hr after restart Daily HL, CBC Monitor s/sx bleeding    Leota SauersLisa Rasheen Schewe Pharm.D. CPP, BCPS Clinical Pharmacist [phone] 08/26/2015 3:49 PM

## 2015-08-26 NOTE — Progress Notes (Signed)
eLink Physician-Brief Progress Note Patient Name: Lezlie LyeVerdie Kimpel DOB: 02/23/1935 MRN: [id number]   Date of Service  08/26/2015  HPI/Events of Note  Best Practice  eICU Interventions  SCDs ordered while patient on bedrest     Intervention Category Intermediate Interventions: Best-practice therapies (e.g. DVT, beta blocker, etc.)  DETERDING,ELIZABETH 08/26/2015, 1:17 AM

## 2015-08-26 NOTE — Progress Notes (Signed)
*  PRELIMINARY RESULTS* Echocardiogram 2D Echocardiogram has been performed.  Kathleen Higgins, Kathleen Higgins 08/26/2015, 9:52 AM

## 2015-08-26 NOTE — Progress Notes (Signed)
Advanced Heart Failure Rounding Note   Subjective:    RHC 10/28 with marked volume overload and low CO. Started on milrinone. Co-ox improved 34 -> 60%. However went back down to 34%. Started on dobutamine 5 mcg.  Yesterday she diuresed with IV lasix 80 mg tid + metolazone. Weight unchanged. Negative 1.0 liters.    Complaining dyspnea and nausea.    Objective:   Weight Range:  Vital Signs:   Temp:  [97.2 F (36.2 C)-98.9 F (37.2 C)] 97.6 F (36.4 C) (11/02 0400) Pulse Rate:  [64-98] 75 (11/02 0600) Resp:  [12-23] 16 (11/02 0600) BP: (75-123)/(38-82) 112/76 mmHg (11/02 0600) SpO2:  [98 %-100 %] 99 % (11/02 0600) Weight:  [166 lb 1.6 oz (75.342 kg)] 166 lb 1.6 oz (75.342 kg) (11/02 0342) Last BM Date: 08/24/15  Weight change: Filed Weights   08/24/15 0500 08/25/15 0326 08/26/15 0342  Weight: 169 lb 12.1 oz (77 kg) 166 lb 3.2 oz (75.388 kg) 166 lb 1.6 oz (75.342 kg)    Intake/Output:   Intake/Output Summary (Last 24 hours) at 08/26/15 0735 Last data filed at 08/26/15 0700  Gross per 24 hour  Intake   1423 ml  Output   2450 ml  Net  -1027 ml     Physical Exam: CVP 22 General: Chronically ill-appearing. No respiratory difficulty. In chair.   HEENT: normal Neck: supple. JVP to jaw. R CEA scar Carotids 2+ bilat; no bruits. No lymphadenopathy or thryomegaly appreciated. Cor: PMI laterally displaced. Irregular rate & rhythm. No rubs, gallops or murmurs. Lungs: clear with decreased BS throughout Abdomen: soft, nontender, nondistended. No hepatosplenomegaly. No bruits or masses. Good bowel sounds. Extremities: no cyanosis, clubbing, rash, trace edema large r groin ecchymosis. Bruit resolved. Neuro: alert & oriented x 3, cranial nerves grossly intact. moves all 4 extremities w/o difficulty. Affect pleasant.    Telemetry: AF 80s  Labs: Basic Metabolic Panel:  Recent Labs Lab 05-03-15 1900  08/23/15 0430 08/24/15 0540 08/24/15 1725 08/25/15 0326  08/26/15 0330  NA 131*  < > 128* 125* 122* 120* 121*  K 3.7  < > 4.4 2.9* 3.8 3.8 4.2  CL 93*  < > 87* 81* 78* 76* 75*  CO2 28  < > 29 34* 32 33* 34*  GLUCOSE 185*  < > 151* 107* 144* 179* 190*  BUN 18  < > 22* 20 18 20  23*  CREATININE 1.38*  < > 1.58* 1.45* 1.34* 1.49* 1.65*  CALCIUM 8.6*  < > 9.0 8.8* 9.0 8.7* 9.1  MG 1.8  --  1.8  --   --   --   --   < > = values in this interval not displayed.  Liver Function Tests:  Recent Labs Lab 05-03-15 1900 08/22/15 0553  AST 37 32  ALT 86* 77*  ALKPHOS 68 62  BILITOT 1.2 0.9  PROT 5.0* 4.7*  ALBUMIN 2.7* 2.7*   No results for input(s): LIPASE, AMYLASE in the last 168 hours. No results for input(s): AMMONIA in the last 168 hours.  CBC:  Recent Labs Lab 05-03-15 1900  08/22/15 0815 08/23/15 0430 08/24/15 0540 08/25/15 0326 08/26/15 0330  WBC 6.0  < > 7.0 9.8 6.4 6.3 6.7  NEUTROABS 4.1  --   --   --   --   --   --   HGB 9.8*  < > 9.0* 9.2* 8.1* 8.3* 8.5*  HCT 31.2*  < > 27.5* 28.4* 24.4* 25.9* 25.8*  MCV 96.0  < >  95.2 94.4 93.5 93.8 92.8  PLT 150  < > 135* 145* 107* 116* 130*  < > = values in this interval not displayed.  Cardiac Enzymes: No results for input(s): CKTOTAL, CKMB, CKMBINDEX, TROPONINI in the last 168 hours.  BNP: BNP (last 3 results)  Recent Labs  08/23/15 0650  BNP 1734.9*    ProBNP (last 3 results) No results for input(s): PROBNP in the last 8760 hours.    Other results:  Imaging: No results found.   Medications:     Scheduled Medications: . antiseptic oral rinse  7 mL Mouth Rinse BID  . aspirin EC  81 mg Oral Daily  . furosemide  80 mg Intravenous 3 times per day  . levothyroxine  50 mcg Oral QAC breakfast  . pantoprazole  40 mg Oral Q0600  . polyethylene glycol  17 g Oral Daily  . potassium chloride  40 mEq Oral BID  . sodium chloride  3 mL Intravenous Q12H  . spironolactone  12.5 mg Oral Daily    Infusions: . amiodarone 30 mg/hr (08/26/15 0056)  . DOBUTamine 5  mcg/kg/min (08/25/15 0700)    PRN Medications: sodium chloride, acetaminophen, fentaNYL (SUBLIMAZE) injection, levalbuterol, ondansetron (ZOFRAN) IV, sodium chloride, sorbitol   Assessment:   1. Cardiogenic shock 2. A/C systolic HF EF 10-15% 3. CAD s/p CABG 2003, PCI RCA 2014 4. PAF, onset 8/14  --coumadin stopped due to elevated INR 5. LBBB 6. PAD 7. COPD 8. A/C renal failure, stage 4  -h/o solitary kidney due to kidney stones.  9. VT 10. R groin hematoma/PSA 11. Hypokalemia  Plan/Discussion:    Remains quite tenuous. CO -OX 40% on dobutamine 5 mcg. Repeat CO-OX 38%   Renal function trending up . CVP 22. Stop intermittent lasix. Start lasix 15 mg per hour + metolazone. Considered hydralazine for afterload but she has some low BP. Considered sildenafil but concerned about volume overload. Add norepi 5 mcg.    Once stable and we can safely restart anti-coagulation will consider TEE/DC-CV later in week. Continue amio.   Has not tolerated coumadin in past due to labile INRs. Will use Eliquis once groin site completely stable.    Length of Stay: 5  Amy Clegg NP-C  08/26/2015, 7:35 AM  Advanced Heart Failure Team Pager [phone] (M-F; 7a - 4p)  Please contact [facility] for night-coverage after hours (4p -7a ) and weekends on [email or web address]  Patient seen and examined with Tonye Becket, NP. We discussed all aspects of the encounter. I agree with the assessment and plan as stated above.   She has progressively worsening cardiogenic shock despite inotropic support. Will add norepinephrine and re-check co-ox. Given high SVR, if not improving can consider trial of nipride for vasodilation. Will need to increase lasix.   Will need to be more stable prior to attempting TEE and DC-CV. Ok to restart heparin.   May be getting to point where Hospice will be best option.   The patient is critically ill with multiple organ systems failure and requires high complexity decision making for  assessment and support, frequent evaluation and titration of therapies, application of advanced monitoring technologies and extensive interpretation of multiple databases.   Critical Care Time devoted to patient care services described in this note is 45 Minutes.  Carlis Burnsworth,MD 3:22 PM

## 2015-08-27 LAB — CARBOXYHEMOGLOBIN
CARBOXYHEMOGLOBIN: 1.4 % (ref 0.5–1.5)
CARBOXYHEMOGLOBIN: 1.9 % — AB (ref 0.5–1.5)
METHEMOGLOBIN: 0.7 % (ref 0.0–1.5)
Methemoglobin: 0.5 % (ref 0.0–1.5)
O2 SAT: 43 %
O2 Saturation: 68.3 %
TOTAL HEMOGLOBIN: 14.3 g/dL (ref 12.0–16.0)
Total hemoglobin: 7.6 g/dL — ABNORMAL LOW (ref 12.0–16.0)

## 2015-08-27 LAB — BASIC METABOLIC PANEL
ANION GAP: 14 (ref 5–15)
Anion gap: 12 (ref 5–15)
BUN: 23 mg/dL — AB (ref 6–20)
BUN: 20 mg/dL (ref 6–20)
CALCIUM: 8.6 mg/dL — AB (ref 8.9–10.3)
CO2: 32 mmol/L (ref 22–32)
CO2: 34 mmol/L — ABNORMAL HIGH (ref 22–32)
CREATININE: 1.8 mg/dL — AB (ref 0.44–1.00)
Calcium: 9.1 mg/dL (ref 8.9–10.3)
Chloride: 72 mmol/L — ABNORMAL LOW (ref 101–111)
Chloride: 67 mmol/L — ABNORMAL LOW (ref 101–111)
Creatinine, Ser: 1.69 mg/dL — ABNORMAL HIGH (ref 0.44–1.00)
GFR calc Af Amer: 30 mL/min — ABNORMAL LOW (ref >60)
GFR, EST AFRICAN AMERICAN: 32 mL/min — AB (ref >60)
GFR, EST NON AFRICAN AMERICAN: 26 mL/min — AB (ref >60)
GFR, EST NON AFRICAN AMERICAN: 28 mL/min — AB (ref >60)
GLUCOSE: 183 mg/dL — AB (ref 65–99)
GLUCOSE: 203 mg/dL — AB (ref 65–99)
POTASSIUM: 2.7 mmol/L — AB (ref 3.5–5.1)
Potassium: 3.4 mmol/L — ABNORMAL LOW (ref 3.5–5.1)
SODIUM: 116 mmol/L — AB (ref 135–145)
SODIUM: 115 mmol/L — AB (ref 135–145)

## 2015-08-27 LAB — CBC
HCT: 28.3 % — ABNORMAL LOW (ref 36.0–46.0)
HEMATOCRIT: 24.9 % — AB (ref 36.0–46.0)
HEMOGLOBIN: 8.4 g/dL — AB (ref 12.0–15.0)
Hemoglobin: 9.4 g/dL — ABNORMAL LOW (ref 12.0–15.0)
MCH: 29.9 pg (ref 26.0–34.0)
MCH: 30 pg (ref 26.0–34.0)
MCHC: 33.2 g/dL (ref 30.0–36.0)
MCHC: 33.7 g/dL (ref 30.0–36.0)
MCV: 90.1 fL (ref 78.0–100.0)
MCV: 88.9 fL (ref 78.0–100.0)
PLATELETS: 189 10*3/uL (ref 150–400)
Platelets: 157 10*3/uL (ref 150–400)
RBC: 3.14 MIL/uL — ABNORMAL LOW (ref 3.87–5.11)
RBC: 2.8 MIL/uL — AB (ref 3.87–5.11)
RDW: 16.2 % — AB (ref 11.5–15.5)
RDW: 15.9 % — ABNORMAL HIGH (ref 11.5–15.5)
WBC: 9.7 10*3/uL (ref 4.0–10.5)
WBC: 6.3 10*3/uL (ref 4.0–10.5)

## 2015-08-27 LAB — TYPE AND SCREEN
ABO/RH(D): O POS
ANTIBODY SCREEN: NEGATIVE

## 2015-08-27 LAB — HEPARIN LEVEL (UNFRACTIONATED)
HEPARIN UNFRACTIONATED: 0.25 [IU]/mL — AB (ref 0.30–0.70)
HEPARIN UNFRACTIONATED: 0.29 [IU]/mL — AB (ref 0.30–0.70)

## 2015-08-27 LAB — ABO/RH: ABO/RH(D): O POS

## 2015-08-27 MED ORDER — HEPARIN (PORCINE) IN NACL 100-0.45 UNIT/ML-% IJ SOLN: 1100.0000 [IU]/h | INTRAMUSCULAR | Status: DC

## 2015-08-27 MED ORDER — TOLVAPTAN 15 MG PO TABS
15.0000 mg | ORAL_TABLET | ORAL | Status: DC
  Administered 2015-08-27: 15 mg via ORAL
  Filled 2015-08-27 (×2): qty 1

## 2015-08-27 MED ORDER — SODIUM CHLORIDE 0.9 % IV SOLN
25.0000 ug/h | INTRAVENOUS | Status: DC
  Administered 2015-08-27: 25 ug/h via INTRAVENOUS
  Filled 2015-08-27: qty 50

## 2015-08-27 MED ORDER — DEXTROSE 5 % IV SOLN
0.0000 ug/kg/min | INTRAVENOUS | Status: DC
  Administered 2015-08-27: 0.3 ug/kg/min via INTRAVENOUS
  Administered 2015-08-28 – 2015-08-29 (×3): 0.75 ug/kg/min via INTRAVENOUS
  Filled 2015-08-27 (×4): qty 2

## 2015-08-27 MED ORDER — POTASSIUM CHLORIDE 10 MEQ/50ML IV SOLN
10.0000 meq | INTRAVENOUS | Status: AC
  Administered 2015-08-27 – 2015-08-28 (×6): 10 meq via INTRAVENOUS
  Filled 2015-08-27 (×6): qty 50

## 2015-08-27 MED ORDER — HEPARIN (PORCINE) IN NACL 100-0.45 UNIT/ML-% IJ SOLN
1150.0000 [IU]/h | INTRAMUSCULAR | Status: DC
  Administered 2015-08-27: 1000 [IU]/h via INTRAVENOUS
  Administered 2015-08-28: 1150 [IU]/h via INTRAVENOUS
  Filled 2015-08-27 (×2): qty 250

## 2015-08-27 MED ORDER — SODIUM CHLORIDE 0.9 % IV SOLN
1.0000 mg/h | INTRAVENOUS | Status: DC
  Administered 2015-08-27 – 2015-08-28 (×2): 1 mg/h via INTRAVENOUS
  Filled 2015-08-27 (×2): qty 10

## 2015-08-27 MED ORDER — POTASSIUM CHLORIDE CRYS ER 20 MEQ PO TBCR: 40.0000 meq | EXTENDED_RELEASE_TABLET | Freq: Once | ORAL | Status: DC

## 2015-08-27 NOTE — Progress Notes (Signed)
Advanced Heart Failure Rounding Note   Subjective:    RHC 10/28 with marked volume overload and low CO. Started on milrinone. Co-ox improved 34 -> 60%. However went back down to 34%. Started on dobutamine 5 mcg.  Yesterday norepi added +  Dobutamine. CO-OX was down to 38% and improved 50% but today back down 43%. Norepie increased to 10 mcg. Started on lasix drip 15 mg per hour + metolazone.   Continued  amio drip . Renal function trending up.   Complaining of nausea and abdominal pain..  Creatinine 1.6>1.8 Sodium 116   Objective:   Weight Range:  Vital Signs:   Temp:  [96.9 F (36.1 C)-98.6 F (37 C)] 97.8 F (36.6 C) (11/03 0400) Pulse Rate:  [68-86] 76 (11/03 0600) Resp:  [14-22] 16 (11/03 0600) BP: (107-143)/(65-123) 131/91 mmHg (11/03 0600) SpO2:  [97 %-100 %] 100 % (11/03 0600) Weight:  [168 lb 8 oz (76.431 kg)] 168 lb 8 oz (76.431 kg) (11/03 0300) Last BM Date: 08/25/15  Weight change: Filed Weights   08/25/15 0326 08/26/15 0342 08/27/15 0300  Weight: 166 lb 3.2 oz (75.388 kg) 166 lb 1.6 oz (75.342 kg) 168 lb 8 oz (76.431 kg)    Intake/Output:   Intake/Output Summary (Last 24 hours) at 08/27/15 0704 Last data filed at 08/27/15 0600  Gross per 24 hour  Intake 2521.59 ml  Output   1975 ml  Net 546.59 ml     Physical Exam: CVP 22 General: Chronically ill-appearing. No respiratory difficulty. In bed.   HEENT: normal Neck: supple. JVP to jaw. R CEA scar Carotids 2+ bilat; no bruits. No lymphadenopathy or thryomegaly appreciated. Cor: PMI laterally displaced. Irregular rate & rhythm. No rubs, gallops or murmurs. Lungs: clear with decreased BS throughout Abdomen: soft, nontender, nondistended. No hepatosplenomegaly. No bruits or masses. Good bowel sounds. Extremities: no cyanosis, clubbing, rash, trace edema large r groin ecchymosis. Bruit resolved. Neuro: alert & oriented x 3, cranial nerves grossly intact. moves all 4 extremities w/o difficulty. Affect  pleasant.    Telemetry: AF 80s  Labs: Basic Metabolic Panel:  Recent Labs Lab 2014/11/12 1900  08/23/15 0430 08/24/15 0540 08/24/15 1725 08/25/15 0326 08/26/15 0330 08/27/15 0415  NA 131*  < > 128* 125* 122* 120* 121* 116*  K 3.7  < > 4.4 2.9* 3.8 3.8 4.2 3.4*  CL 93*  < > 87* 81* 78* 76* 75* 72*  CO2 28  < > 29 34* 32 33* 34* 32  GLUCOSE 185*  < > 151* 107* 144* 179* 190* 183*  BUN 18  < > 22* 20 18 20  23* 23*  CREATININE 1.38*  < > 1.58* 1.45* 1.34* 1.49* 1.65* 1.80*  CALCIUM 8.6*  < > 9.0 8.8* 9.0 8.7* 9.1 9.1  MG 1.8  --  1.8  --   --   --   --   --   < > = values in this interval not displayed.  Liver Function Tests:  Recent Labs Lab 2014/11/12 1900 08/22/15 0553  AST 37 32  ALT 86* 77*  ALKPHOS 68 62  BILITOT 1.2 0.9  PROT 5.0* 4.7*  ALBUMIN 2.7* 2.7*   No results for input(s): LIPASE, AMYLASE in the last 168 hours. No results for input(s): AMMONIA in the last 168 hours.  CBC:  Recent Labs Lab 2014/11/12 1900  08/23/15 0430 08/24/15 0540 08/25/15 0326 08/26/15 0330 08/27/15 0415  WBC 6.0  < > 9.8 6.4 6.3 6.7 9.7  NEUTROABS 4.1  --   --   --   --   --   --  HGB 9.8*  < > 9.2* 8.1* 8.3* 8.5* 9.4*  HCT 31.2*  < > 28.4* 24.4* 25.9* 25.8* 28.3*  MCV 96.0  < > 94.4 93.5 93.8 92.8 90.1  PLT 150  < > 145* 107* 116* 130* 189  < > = values in this interval not displayed.  Cardiac Enzymes: No results for input(s): CKTOTAL, CKMB, CKMBINDEX, TROPONINI in the last 168 hours.  BNP: BNP (last 3 results)  Recent Labs  08/23/15 0650  BNP 1734.9*    ProBNP (last 3 results) No results for input(s): PROBNP in the last 8760 hours.    Other results:  Imaging: No results found.   Medications:     Scheduled Medications: . antiseptic oral rinse  7 mL Mouth Rinse BID  . aspirin EC  81 mg Oral Daily  . levothyroxine  50 mcg Oral QAC breakfast  . metolazone  5 mg Oral Daily  . pantoprazole  40 mg Oral Q0600  . polyethylene glycol  17 g Oral Daily  .  potassium chloride  40 mEq Oral BID  . sodium chloride  3 mL Intravenous Q12H  . spironolactone  12.5 mg Oral Daily    Infusions: . amiodarone 30 mg/hr (08/26/15 2206)  . DOBUTamine 5 mcg/kg/min (08/27/15 0105)  . furosemide (LASIX) infusion 15 mg/hr (08/27/15 0306)  . heparin 1,050 Units/hr (08/26/15 2355)  . norepinephrine (LEVOPHED) Adult infusion 10 mcg/min (08/27/15 0308)    PRN Medications: sodium chloride, acetaminophen, fentaNYL (SUBLIMAZE) injection, levalbuterol, ondansetron (ZOFRAN) IV, sodium chloride, sorbitol   Assessment:   1. Cardiogenic shock 2. A/C systolic HF EF 10-15% 3. CAD s/p CABG 2003, PCI RCA 2014 4. PAF, onset 8/14  --coumadin stopped due to elevated INR 5. LBBB 6. PAD 7. COPD 8. A/C renal failure, stage 4  -h/o solitary kidney due to kidney stones.  9. VT 10. R groin hematoma/PSA 11. Hypokalemia 12. Hyonatremia.   Plan/Discussion:    Remains quite tenuous. On dual pressors dobutamine 5 mcg + norepi 10 mcg.  CO-OX back down to 43%. CVP 22. Continue lasix drip 15 mg per hour. Renal function worsening.    Add tolvaptan. Sodium 116. Limit fluid intake. Check sodium at 1200.  Once stable and we can safely restart anti-coagulation will consider TEE/DC-CV later in week. Continue amio.   Has not tolerated coumadin in past due to labile INRs. Will use Eliquis once groin site completely stable.    Length of Stay: 6  Amy Clegg NP-C  08/27/2015, 7:04 AM  Advanced Heart Failure Team Pager [phone] (M-F; 7a - 4p)  Please contact [facility] for night-coverage after hours (4p -7a ) and weekends on [email or web address]  Patient seen and examined with Tonye Becket, NP. We discussed all aspects of the encounter. I agree with the assessment and plan as stated above.   She is actively dying from advanced HF with dropping co-ox despite dual pressors. She is very uncomfortable with gut ischemia. Unfortunately we have run out of options for her. Have strongly  suggested a switch to comfort care. She tells me that she realizes that she is dying but she is afraid of it. We talked about this at length with her daughter present. She would like to go home but unfortunately she would likely not survive the ambulance ride home. She is now DNR and we will start versed and fentanyl drips at low-dose for comfort purposes. We will wean down norepi and attempt nipride to improve cardiac output and make her more comfortable. Will continue  IV lasix and give one dose tolvaptan with Na 118.   The patient is critically ill with multiple organ systems failure and requires high complexity decision making for assessment and support, frequent evaluation and titration of therapies, application of advanced monitoring technologies and extensive interpretation of multiple databases.   Critical Care Time devoted to patient care services described in this note is 45 Minutes.  Bensimhon, Daniel,MD 10:11 AM

## 2015-08-27 NOTE — Progress Notes (Signed)
Johann Caperse. Bensimhon and Amy Clegg NP updated on pts current status, CVP, UO and meds. Orders received

## 2015-08-27 NOTE — Progress Notes (Signed)
CRITICAL VALUE ALERT  Critical value received:pK 2.7     Na 115  Date of notification:  08/27/15  Time of notification: 1756  Critical value read back:yes  Nurse who received alert:  P.Rance MuirAlvord Rn  MD notified (1st page): Dr Gala RomneyBensimhon  Time of first page:  1800  MD notified (2nd page):  Time of second page:  Responding MD: Dr. Gala RomneyBensimhon  Time MD responded:  1800

## 2015-08-27 NOTE — Progress Notes (Signed)
Responded to page to provide support to patient who is nearing end of life.  Patient is alert and able to communicate with me.  Patient indicated that she was in pain and  afraid of dying. I listened to patient concerns and needs and shared words of comfort  that would help to calm her fears and bring her peace and give her meaning. Together with her daughter we spoke of her life journey and ensured her that she was not alone and that God was aware of all that she was experiencing.  I sat with patient and daughter at bedside and explored with patient those things that were important to her at this time.  Per request of patient and daughter I prayed and provided emotional, spiritual and anticipatory grief support.  Patient appeared to be more accepting that she was nearing end of life. Other family is expected to arrive later this evening.  Will follow as needed and will pass on to unit chaplain for continued support as needed.   08/27/15 1100  Clinical Encounter Type  Visited With Patient and family together;Health care provider  Visit Type Initial;Spiritual support;Patient actively dying  Referral From Family;Nurse  Spiritual Encounters  Spiritual Needs Prayer;Emotional;Grief support  Stress Factors  Patient Stress Factors Major life changes  Venida JarvisWatlington, Tristen Pennino, Chaplain,pager [phone]

## 2015-08-27 NOTE — Progress Notes (Signed)
ANTICOAGULATION CONSULT NOTE - Follow Up Consult  Pharmacy Consult for heparin Indication: atrial fibrillation  Allergies  Allergen Reactions  . Ciprofloxacin Other (See Comments)    dysuria  . Metolazone Nausea And Vomiting  . Penicillins Hives  . Prednisone Other (See Comments)    Thin skin   . Torsemide Other (See Comments)    dysuria  . Levofloxacin Other (See Comments)    unknown  . Statins Other (See Comments)    unknown  . Sulfa Antibiotics Other (See Comments)    unknown    Patient Measurements: Height: 5\' 2"  (157.5 cm) Weight: 168 lb 8 oz (76.431 kg) IBW/kg (Calculated) : 50.1 Heparin Dosing Weight: 66.7 kg  Vital Signs: Temp: 97.4 F (36.3 C) (11/03 0700) Temp Source: Oral (11/03 0700) BP: 114/83 mmHg (11/03 0700) Pulse Rate: 82 (11/03 0700)  Labs:  Recent Labs  08/25/15 0326 08/26/15 0330 08/26/15 2155 08/27/15 0415 08/27/15 0800  HGB 8.3* 8.5*  --  9.4*  --   HCT 25.9* 25.8*  --  28.3*  --   PLT 116* 130*  --  189  --   HEPARINUNFRC  --   --  0.11* 0.25* 0.29*  CREATININE 1.49* 1.65*  --  1.80*  --     Estimated Creatinine Clearance: 24.6 mL/min (by C-G formula based on Cr of 1.8).   Medications:  Infusions:  . amiodarone 30 mg/hr (08/26/15 2206)  . DOBUTamine 5 mcg/kg/min (08/27/15 0105)  . furosemide (LASIX) infusion 15 mg/hr (08/27/15 0306)  . heparin    . norepinephrine (LEVOPHED) Adult infusion 10 mcg/min (08/27/15 0308)    Assessment: [age] YOF admitted 21-Dec-2014 from [facility] for cardiogenic shock mgmt. Presented to OSH with vol overload, hypotension, & worsening renal function.   Prev on warfarin for AFib stopped earlier this mo d/t supratherapeutic level to 7 and no longer on anticoagulation. Pt CHADS2VASc score of at least 5. Cardiac cath on 10/28 showed severe 3-vessel disease with chronic occlusion of LM and prox RCA and profound cardiogenic shock w/ elevated filling pressures. Med mgmt initiated for CAD and pt started on  inotrope for low output. Heparin drip started for anticoagulation while determining long term plan, which was stopped 10/31 for groin hematoma and restarted 11/2.  HL 0.29 slightly subtherapeutic on 1050 units/hr. Hgb 9.4, plt wnl. Bruising on R lower leg unchanged. No noted bleeding.   Goal of Therapy:    Heparin level 0.3-0.7 units/ml Monitor platelets by anticoagulation protocol: Yes   Plan:  Increase heparin to 1050 units/h 1800 HL Daily CBC/HL Monitor s/sx bleeding or hematoma     Hillery AldoElizabeth Joseandres Mazer, Pharm.D. PGY2 Cardiology Pharmacy Resident Pager: [phone] 08/27/2015 9:18 AM

## 2015-08-27 NOTE — Progress Notes (Signed)
ANTICOAGULATION CONSULT NOTE - Follow Up Consult  Pharmacy Consult for heparin Indication: atrial fibrillation  Allergies  Allergen Reactions  . Ciprofloxacin Other (See Comments)    dysuria  . Metolazone Nausea And Vomiting  . Penicillins Hives  . Prednisone Other (See Comments)    Thin skin   . Torsemide Other (See Comments)    dysuria  . Levofloxacin Other (See Comments)    unknown  . Statins Other (See Comments)    unknown  . Sulfa Antibiotics Other (See Comments)    unknown    Patient Measurements: Height: 5\' 2"  (157.5 cm) Weight: 168 lb 8 oz (76.431 kg) IBW/kg (Calculated) : 50.1 Heparin Dosing Weight: 66.7 kg  Vital Signs: Temp: 97.4 F (36.3 C) (11/03 1600) Temp Source: Oral (11/03 1600) BP: 105/58 mmHg (11/03 1800) Pulse Rate: 75 (11/03 1800)  Labs:  Recent Labs  08/26/15 0330 08/26/15 2155 08/27/15 0415 08/27/15 0800 08/27/15 1635  HGB 8.5*  --  9.4*  --  8.4*  HCT 25.8*  --  28.3*  --  24.9*  PLT 130*  --  189  --  157  HEPARINUNFRC  --  0.11* 0.25* 0.29*  --   CREATININE 1.65*  --  1.80*  --  1.69*    Estimated Creatinine Clearance: 26.2 mL/min (by C-G formula based on Cr of 1.69).   Medications:  Infusions:  . amiodarone 30 mg/hr (08/27/15 0925)  . DOBUTamine 5 mcg/kg/min (08/27/15 0800)  . fentaNYL infusion INTRAVENOUS Stopped (08/27/15 1545)  . furosemide (LASIX) infusion 5 mg/hr (08/27/15 1828)  . midazolam (VERSED) infusion 1 mg/hr (08/27/15 1141)  . nitroPRUSSide 0.75 mcg/kg/min (08/27/15 1530)  . norepinephrine (LEVOPHED) Adult infusion 2.5 mcg/min (08/27/15 1545)    Assessment: [age] YOF admitted October 02, 2015 from [facility] for cardiogenic shock mgmt. Presented to OSH with volume overload, hypotension, & worsening renal function.   Prev on warfarin for AFib stopped earlier this month d/t supratherapeutic level to 7 and no longer on anticoagulation. Pt CHADS2VASc score of at least 5. Cardiac cath on 10/28 showed severe 3-vessel  disease with chronic occlusion of LM and prox RCA and profound cardiogenic shock w/ elevated filling pressures. Med mgmt initiated for CAD and pt started on inotrope for low output.   Heparin drip resumed -will start at lower rate than previous with known hematoma.   Goal of Therapy:    Heparin level 0.3-0.7 units/ml Monitor platelets by anticoagulation protocol: Yes   Plan:  Restart heparin at 1000 units/h 8hr heparin level - ok to do with AM labs Daily CBC/HL Monitor s/sx bleeding or hematoma    Link SnufferJessica Caitlain Tweed, PharmD, BCPS Clinical Pharmacist [phone] 08/27/2015 7:49 PM

## 2015-08-27 NOTE — Progress Notes (Signed)
PT Cancellation Note  Patient Details Name: Kathleen LyeVerdie Krider MRN: [id number] DOB: [birth year]   Cancelled Treatment:    Reason Eval/Treat Not Completed: Medical issues which prohibited therapy (Pt with medical decline per nurse.  Sign off. )   Berline LopesWhite, Latosha Gaylord F 08/27/2015, 2:17 PM [facility] Chellsie Gomer,PT Acute Rehabilitation [phone])

## 2015-08-28 ENCOUNTER — Inpatient Hospital Stay (HOSPITAL_COMMUNITY): Payer: Medicare Other

## 2015-08-28 DIAGNOSIS — I4891 Unspecified atrial fibrillation: Secondary | ICD-10-CM

## 2015-08-28 LAB — CARBOXYHEMOGLOBIN
CARBOXYHEMOGLOBIN: 1.6 % — AB (ref 0.5–1.5)
Methemoglobin: 1 % (ref 0.0–1.5)
O2 Saturation: 46 %
Total hemoglobin: 8.5 g/dL — ABNORMAL LOW (ref 12.0–16.0)

## 2015-08-28 LAB — CBC
HCT: 25.9 % — ABNORMAL LOW (ref 36.0–46.0)
HEMOGLOBIN: 8.7 g/dL — AB (ref 12.0–15.0)
MCH: 30.6 pg (ref 26.0–34.0)
MCHC: 33.6 g/dL (ref 30.0–36.0)
MCV: 91.2 fL (ref 78.0–100.0)
Platelets: 151 10*3/uL (ref 150–400)
RBC: 2.84 MIL/uL — ABNORMAL LOW (ref 3.87–5.11)
RDW: 16.1 % — ABNORMAL HIGH (ref 11.5–15.5)
WBC: 6.4 10*3/uL (ref 4.0–10.5)

## 2015-08-28 LAB — BASIC METABOLIC PANEL
Anion gap: 11 (ref 5–15)
BUN: 21 mg/dL — AB (ref 6–20)
CHLORIDE: 70 mmol/L — AB (ref 101–111)
CO2: 35 mmol/L — AB (ref 22–32)
CREATININE: 1.76 mg/dL — AB (ref 0.44–1.00)
Calcium: 8.7 mg/dL — ABNORMAL LOW (ref 8.9–10.3)
GFR calc Af Amer: 31 mL/min — ABNORMAL LOW (ref >60)
GFR calc non Af Amer: 27 mL/min — ABNORMAL LOW (ref >60)
GLUCOSE: 147 mg/dL — AB (ref 65–99)
Potassium: 4.4 mmol/L (ref 3.5–5.1)
SODIUM: 116 mmol/L — AB (ref 135–145)

## 2015-08-28 LAB — HEPARIN LEVEL (UNFRACTIONATED)
HEPARIN UNFRACTIONATED: 0.24 [IU]/mL — AB (ref 0.30–0.70)
Heparin Unfractionated: 0.57 IU/mL (ref 0.30–0.70)
Heparin Unfractionated: 0.69 IU/mL (ref 0.30–0.70)

## 2015-08-28 LAB — SODIUM: Sodium: 119 mmol/L — CL (ref 135–145)

## 2015-08-28 MED ORDER — FENTANYL CITRATE (PF) 100 MCG/2ML IJ SOLN: 75.0000 ug | Freq: Once | INTRAMUSCULAR | Status: DC

## 2015-08-28 MED ORDER — HEPARIN (PORCINE) IN NACL 100-0.45 UNIT/ML-% IJ SOLN
950.0000 [IU]/h | INTRAMUSCULAR | Status: DC
  Administered 2015-08-29: 950 [IU]/h via INTRAVENOUS
  Filled 2015-08-28 (×2): qty 250

## 2015-08-28 MED ORDER — TOLVAPTAN 15 MG PO TABS
15.0000 mg | ORAL_TABLET | Freq: Once | ORAL | Status: AC
  Administered 2015-08-28: 15 mg via ORAL
  Filled 2015-08-28: qty 1

## 2015-08-28 MED ORDER — FLUMAZENIL 0.5 MG/5ML IV SOLN
INTRAVENOUS | Status: AC
  Filled 2015-08-28: qty 5

## 2015-08-28 MED ORDER — MIDAZOLAM HCL 2 MG/2ML IJ SOLN: 4.0000 mg | Freq: Once | INTRAMUSCULAR | Status: DC

## 2015-08-28 MED ORDER — FLUMAZENIL 0.5 MG/5ML IV SOLN
0.2500 mg | Freq: Once | INTRAVENOUS | Status: AC
  Administered 2015-08-28: 0.25 mg via INTRAVENOUS

## 2015-08-28 NOTE — Progress Notes (Signed)
Advanced Heart Failure Rounding Note   Subjective:    Yesterday was in profound shock despite dobutamine and levophed. Nipride added and given a dose of tolvaptan.   Initially good response with co-ox 40% -> 68%. Very good diuresis. Down 5 pounds  Co-ox back down to 46% today. Serum Na 116.   Feels better this am. CVP back up to 10-11   Objective:   Weight Range:  Vital Signs:   Temp:  [97.3 F (36.3 C)-97.6 F (36.4 C)] 97.4 F (36.3 C) (11/04 0750) Pulse Rate:  [66-91] 78 (11/04 0600) Resp:  [10-20] 20 (11/04 0600) BP: (90-122)/(53-90) 109/64 mmHg (11/04 0600) SpO2:  [94 %-100 %] 96 % (11/04 0600) Weight:  [74.1 kg (163 lb 5.8 oz)] 74.1 kg (163 lb 5.8 oz) (11/04 0500) Last BM Date: 08/27/15  Weight change: Filed Weights   08/26/15 0342 08/27/15 0300 08/28/15 0500  Weight: 75.342 kg (166 lb 1.6 oz) 76.431 kg (168 lb 8 oz) 74.1 kg (163 lb 5.8 oz)    Intake/Output:   Intake/Output Summary (Last 24 hours) at 08/28/15 0759 Last data filed at 08/28/15 0600  Gross per 24 hour  Intake 2100.07 ml  Output   4576 ml  Net -2475.93 ml     Physical Exam: CVP 11 General: Chronically ill-appearing. No respiratory difficulty. In bed.   HEENT: normal Neck: supple. JVP to jaw. R CEA scar Carotids 2+ bilat; no bruits. No lymphadenopathy or thryomegaly appreciated. Cor: PMI laterally displaced. Irregular rate & rhythm. No rubs, gallops or murmurs. Lungs: clear with decreased BS throughout Abdomen: soft, nontender, nondistended. No hepatosplenomegaly. No bruits or masses. Good bowel sounds. Extremities: no cyanosis, clubbing, rash, trace edema large r groin ecchymosis. Bruit resolved. Neuro: alert & oriented x 3, cranial nerves grossly intact. moves all 4 extremities w/o difficulty. Affect pleasant.    Telemetry: AF 80s  Labs: Basic Metabolic Panel:  Recent Labs Lab 08/13/2015 1900  08/23/15 0430  08/25/15 0326 08/26/15 0330 08/27/15 0415 08/27/15 1635  08/28/15 0330  NA 131*  < > 128*  < > 120* 121* 116* 115* 116*  K 3.7  < > 4.4  < > 3.8 4.2 3.4* 2.7* 4.4  CL 93*  < > 87*  < > 76* 75* 72* 67* 70*  CO2 28  < > 29  < > 33* 34* 32 34* 35*  GLUCOSE 185*  < > 151*  < > 179* 190* 183* 203* 147*  BUN 18  < > 22*  < > 20 23* 23* 20 21*  CREATININE 1.38*  < > 1.58*  < > 1.49* 1.65* 1.80* 1.69* 1.76*  CALCIUM 8.6*  < > 9.0  < > 8.7* 9.1 9.1 8.6* 8.7*  MG 1.8  --  1.8  --   --   --   --   --   --   < > = values in this interval not displayed.  Liver Function Tests:  Recent Labs Lab 08/04/2015 1900 08/22/15 0553  AST 37 32  ALT 86* 77*  ALKPHOS 68 62  BILITOT 1.2 0.9  PROT 5.0* 4.7*  ALBUMIN 2.7* 2.7*   No results for input(s): LIPASE, AMYLASE in the last 168 hours. No results for input(s): AMMONIA in the last 168 hours.  CBC:  Recent Labs Lab 07/31/2015 1900  08/25/15 0326 08/26/15 0330 08/27/15 0415 08/27/15 1635 08/28/15 0330  WBC 6.0  < > 6.3 6.7 9.7 6.3 6.4  NEUTROABS 4.1  --   --   --   --   --   --  HGB 9.8*  < > 8.3* 8.5* 9.4* 8.4* 8.7*  HCT 31.2*  < > 25.9* 25.8* 28.3* 24.9* 25.9*  MCV 96.0  < > 93.8 92.8 90.1 88.9 91.2  PLT 150  < > 116* 130* 189 157 151  < > = values in this interval not displayed.  Cardiac Enzymes: No results for input(s): CKTOTAL, CKMB, CKMBINDEX, TROPONINI in the last 168 hours.  BNP: BNP (last 3 results)  Recent Labs  08/23/15 0650  BNP 1734.9*    ProBNP (last 3 results) No results for input(s): PROBNP in the last 8760 hours.    Other results:  Imaging: No results found.   Medications:     Scheduled Medications: . antiseptic oral rinse  7 mL Mouth Rinse BID  . aspirin EC  81 mg Oral Daily  . levothyroxine  50 mcg Oral QAC breakfast  . pantoprazole  40 mg Oral Q0600  . polyethylene glycol  17 g Oral Daily  . potassium chloride  40 mEq Oral BID  . potassium chloride  40 mEq Oral Once  . sodium chloride  3 mL Intravenous Q12H  . spironolactone  12.5 mg Oral Daily     Infusions: . amiodarone 30 mg/hr (08/27/15 2055)  . DOBUTamine 5 mcg/kg/min (08/27/15 0800)  . fentaNYL infusion INTRAVENOUS Stopped (08/27/15 1545)  . furosemide (LASIX) infusion 5 mg/hr (08/28/15 0523)  . heparin 1,150 Units/hr (08/28/15 0506)  . midazolam (VERSED) infusion 1 mg/hr (08/27/15 1141)  . nitroPRUSSide 0.75 mcg/kg/min (08/28/15 0028)  . norepinephrine (LEVOPHED) Adult infusion 2.5 mcg/min (08/27/15 1545)    PRN Medications: sodium chloride, acetaminophen, fentaNYL (SUBLIMAZE) injection, levalbuterol, ondansetron (ZOFRAN) IV, sodium chloride, sorbitol   Assessment:   1. Cardiogenic shock 2. A/C systolic HF EF 10-15% 3. CAD s/p CABG 2003, PCI RCA 2014 4. PAF, onset 8/14  --coumadin stopped due to elevated INR 5. LBBB 6. PAD 7. COPD 8. A/C renal failure, stage 4  -h/o solitary kidney due to kidney stones.  9. VT 10. R groin hematoma/PSA 11. Hypokalemia 12. Hyonatremia.   Plan/Discussion:    She feels better today. CVP down but co-ox also back down despite aggressive support. Will increase norep and lasix gtt. Give another dose of tolvaptan.  Long discussion with her and her family about the options. They want to be as aggressive as possible with her care. As a last effort to get her out of shock we will attempt to proceed with TEE -guided cardioversion today. They realize it will be a high-risk procedure and could result in mortality. We will do it at the bedside in the ICU. Continue heparin and amiodarone.   The patient is critically ill with multiple organ systems failure and requires high complexity decision making for assessment and support, frequent evaluation and titration of therapies, application of advanced monitoring technologies and extensive interpretation of multiple databases.   Critical Care Time devoted to patient care services described in this note is 45 Minutes.   Length of Stay: 7  Arvilla MeresBensimhon, Daniel MD  08/28/2015, 7:59  AM  Advanced Heart Failure Team Pager [phone] (M-F; 7a - 4p)  Please contact [facility] for night-coverage after hours (4p -7a ) and weekends on [email or web address]

## 2015-08-28 NOTE — Progress Notes (Signed)
ANTICOAGULATION CONSULT NOTE - Follow Up Consult  Pharmacy Consult for heparin Indication: atrial fibrillation  Allergies  Allergen Reactions  . Ciprofloxacin Other (See Comments)    dysuria  . Metolazone Nausea And Vomiting  . Penicillins Hives  . Prednisone Other (See Comments)    Thin skin   . Torsemide Other (See Comments)    dysuria  . Levofloxacin Other (See Comments)    unknown  . Statins Other (See Comments)    unknown  . Sulfa Antibiotics Other (See Comments)    unknown    Patient Measurements: Height: 5\' 2"  (157.5 cm) Weight: 168 lb 8 oz (76.431 kg) IBW/kg (Calculated) : 50.1 Heparin Dosing Weight: 66.7 kg  Vital Signs: Temp: 97.3 F (36.3 C) (11/04 0400) Temp Source: Oral (11/04 0400) BP: 90/71 mmHg (11/04 0200) Pulse Rate: 87 (11/04 0200)  Labs:  Recent Labs  08/27/15 0415 08/27/15 0800 08/27/15 1635 08/28/15 0330  HGB 9.4*  --  8.4* 8.7*  HCT 28.3*  --  24.9* 25.9*  PLT 189  --  157 151  HEPARINUNFRC 0.25* 0.29*  --  0.24*  CREATININE 1.80*  --  1.69* 1.76*    Estimated Creatinine Clearance: 25.2 mL/min (by C-G formula based on Cr of 1.76).  Assessment: [age] YOF admitted 09/26/2015 from [facility] for cardiogenic shock mgmt. Presented to OSH with volume overload, hypotension, & worsening renal function.   Prev on warfarin for AFib stopped earlier this month d/t supratherapeutic level to 7 and no longer on anticoagulation. Pt CHADS2VASc score of at least 5. Cardiac cath on 10/28 showed severe 3-vessel disease with chronic occlusion of LM and prox RCA and profound cardiogenic shock w/ elevated filling pressures. Med mgmt initiated for CAD and pt started on inotrope for low output.   Heparin drip was resumed at lower rate with known hematoma. Heparin level subtherapeutic (0.24) on 1000 units/hr. No issues with line or bleeding reported per RN.  Goal of Therapy:    Heparin level 0.3-0.7 units/ml Monitor platelets by anticoagulation protocol: Yes    Plan:  Increase heparin to 1150 units/h 8hr heparin level Monitor s/sx bleeding or hematoma   Christoper Fabianaron Oaklen Thiam, PharmD, BCPS Clinical pharmacist, pager [phone] 08/28/2015 5:03 AM

## 2015-08-28 NOTE — Progress Notes (Signed)
RT in on standby for TEE and cardioversion. Patient bagged for 5 minutes while waking back up from procedure. Sats 97% post procedure on 6 Lpm nasal cannula. RN and MD at bedside.

## 2015-08-28 NOTE — Progress Notes (Signed)
CRITICAL VALUE ALERT  Critical value received:  NA, expected value  Date of notification:  08/28/15  Time of notification:  2000  Critical value read back:Yes.    Nurse who received alert:  Alycia Rossettiyan   MD notified (1st page):  Hochrein  Time of first page:  2005

## 2015-08-28 NOTE — Care Management Important Message (Signed)
Important Message  Patient Details  Name: Kathleen Higgins MRN: [id number] Date of Birth: [birth year]   Medicare Important Message Given:  Yes-second notification given    Orson AloeMegan P Thandiwe Siragusa 08/28/2015, 1:23 PM

## 2015-08-28 NOTE — Progress Notes (Signed)
  Echocardiogram Echocardiogram Transesophageal has been performed.  Nolon RodBrown, Tony 08/28/2015, 4:00 PM

## 2015-08-28 NOTE — Progress Notes (Addendum)
ANTICOAGULATION CONSULT NOTE - Follow Up Consult  Pharmacy Consult for heparin Indication: atrial fibrillation  Allergies  Allergen Reactions  . Ciprofloxacin Other (See Comments)    dysuria  . Metolazone Nausea And Vomiting  . Penicillins Hives  . Prednisone Other (See Comments)    Thin skin   . Torsemide Other (See Comments)    dysuria  . Levofloxacin Other (See Comments)    unknown  . Statins Other (See Comments)    unknown  . Sulfa Antibiotics Other (See Comments)    unknown    Patient Measurements: Height: 5\' 2"  (157.5 cm) Weight: 163 lb 5.8 oz (74.1 kg) IBW/kg (Calculated) : 50.1 Heparin Dosing Weight: 66.7 kg  Vital Signs: Temp: 97.5 F (36.4 C) (11/04 1116) Temp Source: Oral (11/04 1116) BP: 102/65 mmHg (11/04 1410) Pulse Rate: 84 (11/04 1410)  Labs:  Recent Labs  08/27/15 0415 08/27/15 0800 08/27/15 1635 08/28/15 0330 08/28/15 1200  HGB 9.4*  --  8.4* 8.7*  --   HCT 28.3*  --  24.9* 25.9*  --   PLT 189  --  157 151  --   HEPARINUNFRC 0.25* 0.29*  --  0.24* 0.57  CREATININE 1.80*  --  1.69* 1.76*  --     Estimated Creatinine Clearance: 24.8 mL/min (by C-G formula based on Cr of 1.76).  Assessment: [age] YOF [facility] mg[facility]. Presented to OSH with volume overload, hypotension, & worsening renal function.   Prev on warfarin for AFib stopped earlier this month d/t supratherapeutic level to 7 and no longer on anticoagulation. Pt CHADS2VASc score of at least 5. Cardiac cath on 10/28 showed severe 3-vessel disease with chronic occlusion of LM and prox RCA and profound cardiogenic shock w/ elevated filling pressures. Med mgmt initiated for CAD and pt started on inotrope for low output.   Heparin drip was resumed at lower rate with known hematoma. Heparin level now therapeutic at 0.57 on 1150 units/hr. No issues with line or bleeding reported per RN.  She is currently undergoing TEE/DCCV.  Goal of Therapy:     Heparin level 0.3-0.7 units/ml Monitor platelets by anticoagulation protocol: Yes   Plan:  Continue heparin at 1150 units/hr Check 8 hour confirmatory level Monitor s/sx bleeding or hematoma   Sheppard CoilFrank Wilson PharmD., BCPS Clinical Pharmacist Pager [phone] 08/28/2015 2:25 PM   Addendum: Heparin level is therapeutic at 0.69 and trending up on 1150 units/hr. Will decrease to 1050 units/hr and follow up AM labs.   Louie CasaJennifer Aryanah Enslow, PharmD, BCPS 08/28/2015, 9:03 PM

## 2015-08-28 NOTE — CV Procedure (Addendum)
    TRANSESOPHAGEAL ECHOCARDIOGRAM GUIDED DIRECT CURRENT CARDIOVERSION  NAME:  Kathleen Higgins   MRN: [id number] DOB:  [birth year]   ADMIT DATE: 08/15/2015  INDICATIONS: Atrial fib with cardiogenic shock   PROCEDURE:   Informed consent was obtained prior to the procedure. The risks, benefits and alternatives for the procedure were discussed and the patient comprehended these risks.  Risks include, but are not limited to, cough, sore throat, vomiting, nausea, somnolence, esophageal and stomach trauma or perforation, bleeding, low blood pressure, aspiration, pneumonia, infection, trauma to the teeth and death.    After a procedural time-out, I personally gave the patient 4 mg versed and 75 mcg fentanyl in staged fashion for moderate sedation.  The oropharynx was anesthetized benzocaine spray.  The transesophageal probe was inserted in the esophagus and stomach without difficulty and multiple views were obtained.    FINDINGS:  LEFT VENTRICLE: EF = 20% Anterior hypokinesis otherwise diffuse akinesis  RIGHT VENTRICLE: Moderately hypokinetic  LEFT ATRIUM: Markedly dilated. Atrial diameter 6.5 cm  LEFT ATRIAL APPENDAGE: No clot  RIGHT ATRIUM: Dialted  AORTIC VALVE:  Trileaflet.  NO AI/AS  MITRAL VALVE:    Moderate to severe central MR  TRICUSPID VALVE: Mild to moderate TR  PULMONIC VALVE: grossly normal  INTERATRIAL SEPTUM: no PFO/ASD  PERICARDIUM: No effusion  DESCENDING AORTA: Mild plaques   CARDIOVERSION:     Indications:  Atrial Fibrillation  Procedure Details:  Once the TEE was complete. The patient received a single biphasic, synchronized 200J shock with prompt conversion to sinus rhythm. Post procedure she remained lethargic and required a short period of bag-mask oxygenation to maintain sats > 90% at all times. 0.4425mcg of flumazenil was given and patient woke up promptly.   COMPLICATIONS:    There were no immediate complications.   CONCLUSION:   1.  Successful  TEE guided cardioversion.    Bensimhon, Daniel,MD 2:45 PM

## 2015-08-28 NOTE — Progress Notes (Signed)
Pharmacist Heart Failure Core Measure Documentation  Assessment: Kathleen LyeVerdie Higgins has an EF documented as 20-25% on 11/2 by Echo Rationale: Heart failure patients with left ventricular systolic dysfunction (LVSD) and an EF < 40% should be prescribed an angiotensin converting enzyme inhibitor (ACEI) or angiotensin receptor blocker (ARB) at discharge unless a contraindication is documented in the medical record.  This patient is not currently on an ACEI or ARB for HF.  This note is being placed in the record in order to provide documentation that a contraindication to the use of these agents is present for this encounter.  ACE Inhibitor or Angiotensin Receptor Blocker is contraindicated (specify all that apply)  []   ACEI allergy AND ARB allergy []   Angioedema []   Moderate or severe aortic stenosis []   Hyperkalemia [x]   Hypotension []   Renal artery stenosis [x]   Worsening renal function, preexisting renal disease or dysfunction  Harland Germanndrew Kaytlyn Din, Pharm D 08/28/2015 11:09 AM

## 2015-08-29 LAB — CARBOXYHEMOGLOBIN
CARBOXYHEMOGLOBIN: 2.1 % — AB (ref 0.5–1.5)
CARBOXYHEMOGLOBIN: 1.7 % — AB (ref 0.5–1.5)
METHEMOGLOBIN: 0.8 % (ref 0.0–1.5)
Methemoglobin: 0.8 % (ref 0.0–1.5)
O2 SAT: 45.7 %
O2 Saturation: 52.9 %
TOTAL HEMOGLOBIN: 8.1 g/dL — AB (ref 12.0–16.0)
Total hemoglobin: 8.6 g/dL — ABNORMAL LOW (ref 12.0–16.0)

## 2015-08-29 LAB — BASIC METABOLIC PANEL
ANION GAP: 10 (ref 5–15)
BUN: 20 mg/dL (ref 6–20)
CHLORIDE: 69 mmol/L — AB (ref 101–111)
CO2: 38 mmol/L — ABNORMAL HIGH (ref 22–32)
Calcium: 8.5 mg/dL — ABNORMAL LOW (ref 8.9–10.3)
Creatinine, Ser: 1.7 mg/dL — ABNORMAL HIGH (ref 0.44–1.00)
GFR, EST AFRICAN AMERICAN: 32 mL/min — AB (ref >60)
GFR, EST NON AFRICAN AMERICAN: 28 mL/min — AB (ref >60)
Glucose, Bld: 187 mg/dL — ABNORMAL HIGH (ref 65–99)
POTASSIUM: 3.8 mmol/L (ref 3.5–5.1)
SODIUM: 117 mmol/L — AB (ref 135–145)

## 2015-08-29 LAB — CBC
HEMATOCRIT: 26.7 % — AB (ref 36.0–46.0)
HEMOGLOBIN: 8.6 g/dL — AB (ref 12.0–15.0)
MCH: 29.6 pg (ref 26.0–34.0)
MCHC: 32.2 g/dL (ref 30.0–36.0)
MCV: 91.8 fL (ref 78.0–100.0)
Platelets: 179 10*3/uL (ref 150–400)
RBC: 2.91 MIL/uL — AB (ref 3.87–5.11)
RDW: 16.5 % — ABNORMAL HIGH (ref 11.5–15.5)
WBC: 7.6 10*3/uL (ref 4.0–10.5)

## 2015-08-29 LAB — HEPARIN LEVEL (UNFRACTIONATED)
HEPARIN UNFRACTIONATED: 0.8 [IU]/mL — AB (ref 0.30–0.70)
HEPARIN UNFRACTIONATED: 0.38 [IU]/mL (ref 0.30–0.70)

## 2015-08-29 MED ORDER — WARFARIN - PHARMACIST DOSING INPATIENT: Freq: Every day | Status: DC

## 2015-08-29 MED ORDER — DOBUTAMINE IN D5W 4-5 MG/ML-% IV SOLN
5.0000 ug/kg/min | INTRAVENOUS | Status: DC
  Administered 2015-08-29 – 2015-08-30 (×2): 5 ug/kg/min via INTRAVENOUS
  Filled 2015-08-29: qty 250

## 2015-08-29 MED ORDER — NITROPRUSSIDE SODIUM 25 MG/ML IV SOLN
0.0000 ug/kg/min | INTRAVENOUS | Status: DC
  Administered 2015-08-29 – 2015-08-31 (×3): 0.75 ug/kg/min via INTRAVENOUS
  Filled 2015-08-29 (×5): qty 2

## 2015-08-29 MED ORDER — RANOLAZINE ER 500 MG PO TB12
500.0000 mg | ORAL_TABLET | Freq: Two times a day (BID) | ORAL | Status: DC
  Administered 2015-08-29 – 2015-08-31 (×5): 500 mg via ORAL
  Filled 2015-08-29 (×5): qty 1

## 2015-08-29 MED ORDER — COUMADIN BOOK
Freq: Once | Status: DC
  Filled 2015-08-29: qty 1

## 2015-08-29 MED ORDER — WARFARIN VIDEO
Freq: Once | Status: AC
  Administered 2015-08-29: 18:00:00

## 2015-08-29 MED ORDER — NOREPINEPHRINE BITARTRATE 1 MG/ML IV SOLN
0.0000 ug/min | INTRAVENOUS | Status: DC
  Administered 2015-08-29 – 2015-08-31 (×2): 5 ug/min via INTRAVENOUS
  Filled 2015-08-29 (×2): qty 16

## 2015-08-29 MED ORDER — WARFARIN SODIUM 2.5 MG PO TABS
2.5000 mg | ORAL_TABLET | Freq: Once | ORAL | Status: AC
  Administered 2015-08-29: 2.5 mg via ORAL
  Filled 2015-08-29: qty 1

## 2015-08-29 NOTE — Progress Notes (Signed)
CRITICAL VALUE ALERT  Critical value received:  NA  Date of notification:  08/29/15  Time of notification:  0600  Critical value read back:Yes.    Nurse who received alert:  Alycia Rossettiyan   MD notified (1st page):  Hochrein   Time of first page:  0610   Responding MD: Antoine PocheHochrein  Time MD responded:  [phone]

## 2015-08-29 NOTE — Progress Notes (Signed)
Advanced Heart Failure Rounding Note   Subjective:    Remains on levophed, dobutamine and nipride. S/p DC-CV on 11/4. Maintaining NSR on IV amio. Co-ox 53  Feels better this am. CVP 13-14. Serum Na 117. No neuro sx,    Objective:   Weight Range:  Vital Signs:   Temp:  [97.4 F (36.3 C)-98.2 F (36.8 C)] 98.2 F (36.8 C) (11/05 0737) Pulse Rate:  [64-84] 71 (11/05 0600) Resp:  [10-21] 18 (11/05 0600) BP: (93-125)/(40-72) 113/61 mmHg (11/05 0600) SpO2:  [84 %-100 %] 98 % (11/05 0600) FiO2 (%):  [4 %-6 %] 4 % (11/04 1900) Weight:  [73.5 kg (162 lb 0.6 oz)] 73.5 kg (162 lb 0.6 oz) (11/05 0500) Last BM Date: 08/27/15  Weight change: Filed Weights   08/27/15 0300 08/28/15 0500 08/29/15 0500  Weight: 76.431 kg (168 lb 8 oz) 74.1 kg (163 lb 5.8 oz) 73.5 kg (162 lb 0.6 oz)    Intake/Output:   Intake/Output Summary (Last 24 hours) at 08/29/15 0948 Last data filed at 08/29/15 0600  Gross per 24 hour  Intake 2133.11 ml  Output   3000 ml  Net -866.89 ml     Physical Exam: CVP 14 General: Chronically ill-appearing. No respiratory difficulty. In chair HEENT: normal Neck: supple. JVP to jaw. R CEA scar Carotids 2+ bilat; no bruits. No lymphadenopathy or thryomegaly appreciated. Cor: PMI laterally displaced. Regular rate & rhythm. No rubs, gallops or murmurs. Lungs: clear with decreased BS throughout Abdomen: soft, nontender, nondistended. No hepatosplenomegaly. No bruits or masses. Good bowel sounds. Extremities: no cyanosis, clubbing, rash, trace edema large r groin ecchymosis. Bruit resolved. Neuro: alert & oriented x 3, cranial nerves grossly intact. moves all 4 extremities w/o difficulty. Affect pleasant.    Telemetry: NSR 60s  Labs: Basic Metabolic Panel:  Recent Labs Lab 08/23/15 0430  08/26/15 0330 08/27/15 0415 08/27/15 1635 08/28/15 0330 08/28/15 2000 08/29/15 0500  NA 128*  < > 121* 116* 115* 116* 119* 117*  K 4.4  < > 4.2 3.4* 2.7* 4.4  --  3.8    CL 87*  < > 75* 72* 67* 70*  --  69*  CO2 29  < > 34* 32 34* 35*  --  38*  GLUCOSE 151*  < > 190* 183* 203* 147*  --  187*  BUN 22*  < > 23* 23* 20 21*  --  20  CREATININE 1.58*  < > 1.65* 1.80* 1.69* 1.76*  --  1.70*  CALCIUM 9.0  < > 9.1 9.1 8.6* 8.7*  --  8.5*  MG 1.8  --   --   --   --   --   --   --   < > = values in this interval not displayed.  Liver Function Tests: No results for input(s): AST, ALT, ALKPHOS, BILITOT, PROT, ALBUMIN in the last 168 hours. No results for input(s): LIPASE, AMYLASE in the last 168 hours. No results for input(s): AMMONIA in the last 168 hours.  CBC:  Recent Labs Lab 08/26/15 0330 08/27/15 0415 08/27/15 1635 08/28/15 0330 08/29/15 0500  WBC 6.7 9.7 6.3 6.4 7.6  HGB 8.5* 9.4* 8.4* 8.7* 8.6*  HCT 25.8* 28.3* 24.9* 25.9* 26.7*  MCV 92.8 90.1 88.9 91.2 91.8  PLT 130* 189 157 151 179    Cardiac Enzymes: No results for input(s): CKTOTAL, CKMB, CKMBINDEX, TROPONINI in the last 168 hours.  BNP: BNP (last 3 results)  Recent Labs  08/23/15 0650  BNP 1734.9*  ProBNP (last 3 results) No results for input(s): PROBNP in the last 8760 hours.    Other results:  Imaging: No results found.   Medications:     Scheduled Medications: . antiseptic oral rinse  7 mL Mouth Rinse BID  . aspirin EC  81 mg Oral Daily  . fentaNYL (SUBLIMAZE) injection  75 mcg Intravenous Once  . levothyroxine  50 mcg Oral QAC breakfast  . midazolam  4 mg Intravenous Once  . pantoprazole  40 mg Oral Q0600  . polyethylene glycol  17 g Oral Daily  . potassium chloride  40 mEq Oral BID  . sodium chloride  3 mL Intravenous Q12H  . spironolactone  12.5 mg Oral Daily    Infusions: . amiodarone 30 mg/hr (08/29/15 0816)  . DOBUTamine 5 mcg/kg/min (08/28/15 1659)  . fentaNYL infusion INTRAVENOUS Stopped (08/27/15 1545)  . furosemide (LASIX) infusion 10 mg/hr (08/28/15 1012)  . heparin 950 Units/hr (08/29/15 0533)  . midazolam (VERSED) infusion 1 mg/hr  (08/28/15 1400)  . nitroPRUSSide 0.75 mcg/kg/min (08/28/15 1608)  . norepinephrine (LEVOPHED) Adult infusion 5 mcg/min (08/28/15 1013)    PRN Medications: sodium chloride, acetaminophen, fentaNYL (SUBLIMAZE) injection, levalbuterol, ondansetron (ZOFRAN) IV, sodium chloride, sorbitol   Assessment:   1. Cardiogenic shock 2. A/C systolic HF EF 10-15% 3. CAD s/p CABG 2003, PCI RCA 2014 4. PAF, onset 8/14  --coumadin stopped due to elevated INR 5. LBBB 6. PAD 7. COPD 8. A/C renal failure, stage 4  -h/o solitary kidney due to kidney stones.  9. VT 10. R groin hematoma/PSA 11. Hypokalemia 12. Hyonatremia.   Plan/Discussion:    Remains in NSR s/p DC-CV. She remains extremely tenuous despite norepi, dobutamine and nipride. Will try to wean norepi today. Continue diuresis. Watch sodium closely (now s/p 2 doses tolvaptan). Continue IV amio for AF. Given size of LA high risk for going back to AF. Will start Ranexa. Start coumadin.   The patient is critically ill with multiple organ systems failure and requires high complexity decision making for assessment and support, frequent evaluation and titration of therapies, application of advanced monitoring technologies and extensive interpretation of multiple databases.   Critical Care Time devoted to patient care services described in this note is 45 Minutes.   Length of Stay: 8  Arvilla MeresBensimhon, Juventino Pavone MD  08/29/2015, 9:48 AM  Advanced Heart Failure Team Pager [phone] (M-F; 7a - 4p)  Please contact [facility] for night-coverage after hours (4p -7a ) and weekends on [email or web address]

## 2015-08-29 NOTE — Progress Notes (Signed)
ANTICOAGULATION CONSULT NOTE - Follow Up Consult  Pharmacy Consult for heparin Indication: atrial fibrillation  Allergies  Allergen Reactions  . Ciprofloxacin Other (See Comments)    dysuria  . Metolazone Nausea And Vomiting  . Penicillins Hives  . Prednisone Other (See Comments)    Thin skin   . Torsemide Other (See Comments)    dysuria  . Levofloxacin Other (See Comments)    unknown  . Statins Other (See Comments)    unknown  . Sulfa Antibiotics Other (See Comments)    unknown    Patient Measurements: Height: 5\' 2"  (157.5 cm) Weight: 163 lb 5.8 oz (74.1 kg) IBW/kg (Calculated) : 50.1 Heparin Dosing Weight: 66.7 kg  Vital Signs: Temp: 97.9 F (36.6 C) (11/05 0414) Temp Source: Oral (11/05 0414) BP: 103/53 mmHg (11/05 0414) Pulse Rate: 72 (11/05 0414)  Labs:  Recent Labs  08/27/15 0415  08/27/15 1635 08/28/15 0330 08/28/15 1200 08/28/15 2000 08/29/15 0500  HGB 9.4*  --  8.4* 8.7*  --   --   --   HCT 28.3*  --  24.9* 25.9*  --   --   --   PLT 189  --  157 151  --   --   --   HEPARINUNFRC 0.25*  < >  --  0.24* 0.57 0.69 0.80*  CREATININE 1.80*  --  1.69* 1.76*  --   --   --   < > = values in this interval not displayed.  Estimated Creatinine Clearance: 24.8 mL/min (by C-G formula based on Cr of 1.76).  Assessment: [age] YOF admitted 08/14/2015 [facility] for cardiogenic shock mgmt. Presented to OSH with volume overload, hypotension, & worsening renal function.   Currently on heparin drip at 1050 units/hr. Heparin level now supra-therapeutic at 0.8 despite rate decrease last night. Patient has extensive bruising but per RN, it remains stable. No issues with line or bleeding reported per RN.  Goal of Therapy:    Heparin level 0.3-0.7 units/ml Monitor platelets by anticoagulation protocol: Yes   Plan:  Decrease heparin to 950 units/hr Check 8 hour confirmatory level Monitor s/sx bleeding or hematoma   Vinnie LevelBenjamin Javarian Jakubiak, PharmD., BCPS Clinical  Pharmacist Pager [phone]

## 2015-08-29 NOTE — Progress Notes (Signed)
Wasted 220ml Fentanyl and 45ml Versed down the sink with Dayna BarkerLauren Dorraine Ellender, RN.  Eliane DecreeSmith, Erin Moore, RN

## 2015-08-29 NOTE — Progress Notes (Signed)
ANTICOAGULATION CONSULT NOTE - Follow Up Consult  Pharmacy Consult for heparin Indication: atrial fibrillation  Allergies  Allergen Reactions  . Ciprofloxacin Other (See Comments)    dysuria  . Metolazone Nausea And Vomiting  . Penicillins Hives  . Prednisone Other (See Comments)    Thin skin   . Torsemide Other (See Comments)    dysuria  . Levofloxacin Other (See Comments)    unknown  . Statins Other (See Comments)    unknown  . Sulfa Antibiotics Other (See Comments)    unknown    Patient Measurements: Height: 5\' 2"  (157.5 cm) Weight: 162 lb 0.6 oz (73.5 kg) IBW/kg (Calculated) : 50.1 Heparin Dosing Weight: 66.7 kg  Vital Signs: Temp: 98.1 F (36.7 C) (11/05 1123) Temp Source: Oral (11/05 1123) BP: 102/82 mmHg (11/05 1100) Pulse Rate: 83 (11/05 1100)  Labs:  Recent Labs  08/27/15 1635 08/28/15 0330  08/28/15 2000 08/29/15 0500 08/29/15 1437  HGB 8.4* 8.7*  --   --  8.6*  --   HCT 24.9* 25.9*  --   --  26.7*  --   PLT 157 151  --   --  179  --   HEPARINUNFRC  --  0.24*  < > 0.69 0.80* 0.38  CREATININE 1.69* 1.76*  --   --  1.70*  --   < > = values in this interval not displayed.  Estimated Creatinine Clearance: 25.6 mL/min (by C-G formula based on Cr of 1.7).  Assessment: [age] YOF admitted 08/05/2015 from [facility] for cardiogenic shock mgmt. Presented to OSH with volume overload, hypotension, & worsening renal function. Pharmacy consulted to dose heparin for afib; coumadin added 11/5 (last INR was 1.34 on 10/28). She is noted on IV amiodarone -Currently on heparin drip at 950 units/hr and heparin level is now at goal (HL= 0.38). -She was initiated on coumadin sometime 07/2015 but the patient can not give any details about her dosing. She reports an INR up to 7.0 and coumadin was discontinued at that time. [facility] was not on [facility] at [facility].   Goal of Therapy:    Heparin level 0.3-0.7 units/ml Monitor platelets by anticoagulation protocol: Yes   Plan:   -No heparin changes needed -Daily heparin level and CBC -Coumadin 2.5mg  po today -Daily PT/INR  Harland GermanAndrew Alita Waldren, Pharm D 08/29/2015 3:33 PM

## 2015-08-30 LAB — BASIC METABOLIC PANEL
ANION GAP: 9 (ref 5–15)
BUN: 21 mg/dL — AB (ref 6–20)
CALCIUM: 8.6 mg/dL — AB (ref 8.9–10.3)
CO2: 38 mmol/L — AB (ref 22–32)
CREATININE: 1.75 mg/dL — AB (ref 0.44–1.00)
Chloride: 68 mmol/L — ABNORMAL LOW (ref 101–111)
GFR calc Af Amer: 31 mL/min — ABNORMAL LOW (ref >60)
GFR, EST NON AFRICAN AMERICAN: 27 mL/min — AB (ref >60)
GLUCOSE: 192 mg/dL — AB (ref 65–99)
Potassium: 4.2 mmol/L (ref 3.5–5.1)
Sodium: 115 mmol/L — CL (ref 135–145)

## 2015-08-30 LAB — CBC
HCT: 27.2 % — ABNORMAL LOW (ref 36.0–46.0)
Hemoglobin: 8.9 g/dL — ABNORMAL LOW (ref 12.0–15.0)
MCH: 30 pg (ref 26.0–34.0)
MCHC: 32.7 g/dL (ref 30.0–36.0)
MCV: 91.6 fL (ref 78.0–100.0)
PLATELETS: 170 10*3/uL (ref 150–400)
RBC: 2.97 MIL/uL — AB (ref 3.87–5.11)
RDW: 16.4 % — AB (ref 11.5–15.5)
WBC: 8.4 10*3/uL (ref 4.0–10.5)

## 2015-08-30 LAB — CARBOXYHEMOGLOBIN
CARBOXYHEMOGLOBIN: 1.6 % — AB (ref 0.5–1.5)
Methemoglobin: 0.8 % (ref 0.0–1.5)
O2 SAT: 54.6 %
TOTAL HEMOGLOBIN: 9 g/dL — AB (ref 12.0–16.0)

## 2015-08-30 LAB — PROTIME-INR
INR: 1.27 (ref 0.00–1.49)
Prothrombin Time: 16 seconds — ABNORMAL HIGH (ref 11.6–15.2)

## 2015-08-30 LAB — HEPARIN LEVEL (UNFRACTIONATED): HEPARIN UNFRACTIONATED: 0.37 [IU]/mL (ref 0.30–0.70)

## 2015-08-30 MED ORDER — SODIUM CHLORIDE 0.9 % IV SOLN
100.0000 ug/h | INTRAVENOUS | Status: DC
  Administered 2015-08-30: 25 ug/h via INTRAVENOUS
  Filled 2015-08-30 (×2): qty 50

## 2015-08-30 MED ORDER — SALINE SPRAY 0.65 % NA SOLN
1.0000 | NASAL | Status: DC | PRN
  Administered 2015-08-30: 1 via NASAL
  Filled 2015-08-30: qty 44

## 2015-08-30 MED ORDER — WARFARIN SODIUM 2.5 MG PO TABS: 2.5000 mg | ORAL_TABLET | Freq: Once | ORAL | Status: DC

## 2015-08-30 MED ORDER — MIDAZOLAM HCL 5 MG/ML IJ SOLN
3.0000 mg/h | INTRAMUSCULAR | Status: DC
  Administered 2015-08-30: 1 mg/h via INTRAVENOUS
  Administered 2015-09-01: 3 mg/h via INTRAVENOUS
  Filled 2015-08-30 (×4): qty 10

## 2015-08-30 MED ORDER — METOLAZONE 5 MG PO TABS
5.0000 mg | ORAL_TABLET | Freq: Two times a day (BID) | ORAL | Status: DC
  Administered 2015-08-30 – 2015-08-31 (×3): 5 mg via ORAL
  Filled 2015-08-30 (×3): qty 1

## 2015-08-30 NOTE — Progress Notes (Addendum)
Advanced Heart Failure Rounding Note   Subjective:    Remains on levophed, dobutamine and nipride. S/p DC-CV on 11/4. Maintaining NSR on IV amio.   Attempted to wean norepi yesterday but co-ox dropped to 46% so increased back to 5.   Feels terrible this am. Nauseated. Threw up all pills. CVP 19 Co-ox 55%  Serum Na 117 -> 115. No neuro sx,    Objective:   Weight Range:  Vital Signs:   Temp:  [97.5 F (36.4 C)-98.5 F (36.9 C)] 98.5 F (36.9 C) (11/06 0800) Pulse Rate:  [62-83] 62 (11/06 0800) Resp:  [12-23] 16 (11/06 0500) BP: (95-121)/(45-88) 103/62 mmHg (11/06 0800) SpO2:  [81 %-100 %] 100 % (11/06 0800) FiO2 (%):  [36 %] 36 % (11/05 1614) Weight:  [73.3 kg (161 lb 9.6 oz)] 73.3 kg (161 lb 9.6 oz) (11/06 0500) Last BM Date: 08/27/15  Weight change: Filed Weights   08/28/15 0500 08/29/15 0500 08/30/15 0500  Weight: 74.1 kg (163 lb 5.8 oz) 73.5 kg (162 lb 0.6 oz) 73.3 kg (161 lb 9.6 oz)    Intake/Output:   Intake/Output Summary (Last 24 hours) at 08/30/15 1053 Last data filed at 08/30/15 0800  Gross per 24 hour  Intake 1674.1 ml  Output   2500 ml  Net -825.9 ml     Physical Exam: CVP 19 General: Chronically ill-appearing.In bed  HEENT: normal Neck: supple. JVP to jaw. R CEA scar Carotids 2+ bilat; no bruits. No lymphadenopathy or thryomegaly appreciated. Cor: PMI laterally displaced. Regular rate & rhythm. No rubs, gallops or murmurs. Lungs: clear with decreased BS throughout Abdomen: soft, nontender, nondistended. No hepatosplenomegaly. No bruits or masses. Good bowel sounds. Extremities: no cyanosis, clubbing, rash, 1+edema large r groin ecchymosis. Bruit resolved. Neuro: alert & oriented x 3, cranial nerves grossly intact. moves all 4 extremities w/o difficulty. Affect pleasant.    Telemetry: NSR 60s  Labs: Basic Metabolic Panel:  Recent Labs Lab 08/27/15 0415 08/27/15 1635 08/28/15 0330 08/28/15 2000 08/29/15 0500 08/30/15 0400  NA 116*  115* 116* 119* 117* 115*  K 3.4* 2.7* 4.4  --  3.8 4.2  CL 72* 67* 70*  --  69* 68*  CO2 32 34* 35*  --  38* 38*  GLUCOSE 183* 203* 147*  --  187* 192*  BUN 23* 20 21*  --  20 21*  CREATININE 1.80* 1.69* 1.76*  --  1.70* 1.75*  CALCIUM 9.1 8.6* 8.7*  --  8.5* 8.6*    Liver Function Tests: No results for input(s): AST, ALT, ALKPHOS, BILITOT, PROT, ALBUMIN in the last 168 hours. No results for input(s): LIPASE, AMYLASE in the last 168 hours. No results for input(s): AMMONIA in the last 168 hours.  CBC:  Recent Labs Lab 08/27/15 0415 08/27/15 1635 08/28/15 0330 08/29/15 0500 08/30/15 0400  WBC 9.7 6.3 6.4 7.6 8.4  HGB 9.4* 8.4* 8.7* 8.6* 8.9*  HCT 28.3* 24.9* 25.9* 26.7* 27.2*  MCV 90.1 88.9 91.2 91.8 91.6  PLT 189 157 151 179 170    Cardiac Enzymes: No results for input(s): CKTOTAL, CKMB, CKMBINDEX, TROPONINI in the last 168 hours.  BNP: BNP (last 3 results)  Recent Labs  08/23/15 0650  BNP 1734.9*    ProBNP (last 3 results) No results for input(s): PROBNP in the last 8760 hours.    Other results:  Imaging: No results found.   Medications:     Scheduled Medications: . antiseptic oral rinse  7 mL Mouth Rinse BID  .  aspirin EC  81 mg Oral Daily  . coumadin book   Does not apply Once  . fentaNYL (SUBLIMAZE) injection  75 mcg Intravenous Once  . levothyroxine  50 mcg Oral QAC breakfast  . midazolam  4 mg Intravenous Once  . pantoprazole  40 mg Oral Q0600  . polyethylene glycol  17 g Oral Daily  . potassium chloride  40 mEq Oral BID  . ranolazine  500 mg Oral BID  . sodium chloride  3 mL Intravenous Q12H  . spironolactone  12.5 mg Oral Daily  . warfarin  2.5 mg Oral ONCE-1800  . Warfarin - Pharmacist Dosing Inpatient   Does not apply q1800    Infusions: . amiodarone 30 mg/hr (08/30/15 0800)  . DOBUTamine 5 mcg/kg/min (08/30/15 0800)  . fentaNYL infusion INTRAVENOUS Stopped (08/27/15 1545)  . furosemide (LASIX) infusion 10 mg/hr (08/30/15 0800)    . heparin 950 Units/hr (08/30/15 0800)  . midazolam (VERSED) infusion Stopped (08/28/15 1430)  . nitroPRUSSide 0.75 mcg/kg/min (08/30/15 0800)  . norepinephrine (LEVOPHED) Adult infusion 5 mcg/min (08/30/15 0800)    PRN Medications: sodium chloride, acetaminophen, fentaNYL (SUBLIMAZE) injection, levalbuterol, ondansetron (ZOFRAN) IV, sodium chloride, sorbitol   Assessment:   1. Cardiogenic shock 2. A/C systolic HF EF 10-15% 3. CAD s/p CABG 2003, PCI RCA 2014 4. PAF, onset 8/14  --coumadin stopped due to elevated INR 5. LBBB 6. PAD 7. COPD 8. A/C renal failure, stage 4  -h/o solitary kidney due to kidney stones.  9. VT 10. R groin hematoma/PSA 11. Hypokalemia 12. Hyonatremia.   Plan/Discussion:    Remains in NSR s/p DC-CV but still with profound shock and unable to wean IV therapies. I think we are truly at the end of the road here with her HF. Will diurese aggressively today with metolazone for comfort. Will add back low-dose versed and fentanyl for comfort as well. Continue other drips for now. Will have family meeting in am to re-address need for comfort care.     The patient is critically ill with multiple organ systems failure and requires high complexity decision making for assessment and support, frequent evaluation and titration of therapies, application of advanced monitoring technologies and extensive interpretation of multiple databases.   Critical Care Time devoted to patient care services described in this note is 45 Minutes.   Length of Stay: 9  Arvilla MeresBensimhon, Aradhana Gin MD  08/30/2015, 10:53 AM  Advanced Heart Failure Team Pager [phone] (M-F; 7a - 4p)  Please contact [facility] for night-coverage after hours (4p -7a ) and weekends on [email or web address]

## 2015-08-30 NOTE — Progress Notes (Signed)
ANTICOAGULATION CONSULT NOTE - Follow Up Consult  Pharmacy Consult for heparin Indication: atrial fibrillation  Allergies  Allergen Reactions  . Ciprofloxacin Other (See Comments)    dysuria  . Metolazone Nausea And Vomiting  . Penicillins Hives  . Prednisone Other (See Comments)    Thin skin   . Torsemide Other (See Comments)    dysuria  . Levofloxacin Other (See Comments)    unknown  . Statins Other (See Comments)    unknown  . Sulfa Antibiotics Other (See Comments)    unknown    Patient Measurements: Height: 5\' 2"  (157.5 cm) Weight: 161 lb 9.6 oz (73.3 kg) IBW/kg (Calculated) : 50.1 Heparin Dosing Weight: 66.7 kg  Vital Signs: Temp: 98.5 F (36.9 C) (11/06 0800) Temp Source: Oral (11/06 0800) BP: 103/62 mmHg (11/06 0800) Pulse Rate: 62 (11/06 0800)  Labs:  Recent Labs  08/28/15 0330  08/29/15 0500 08/29/15 1437 08/30/15 0400  HGB 8.7*  --  8.6*  --  8.9*  HCT 25.9*  --  26.7*  --  27.2*  PLT 151  --  179  --  170  LABPROT  --   --   --   --  16.0*  INR  --   --   --   --  1.27  HEPARINUNFRC 0.24*  < > 0.80* 0.38 0.37  CREATININE 1.76*  --  1.70*  --  1.75*  < > = values in this interval not displayed.  Estimated Creatinine Clearance: 24.8 mL/min (by C-G formula based on Cr of 1.75).  Assessment: [age] YOF admitted 09/17/15 from [facility] for cardiogenic shock mgmt. Presented to OSH with volume overload, hypotension, & worsening renal function. Pharmacy consulted to dose heparin for afib; coumadin added 11/5 (last INR was 1.34 on 10/28). She is noted on IV amiodarone. She is noted with an INR of 7 while on coumadin in 07/2015 but dosing regmien is not clear. -Currently on heparin drip at 950 units/hr and heparin level isat goal (HL= 0.37). INR 1.27 on day 2 coumadin  Goal of Therapy:    Heparin level 0.3-0.7 units/ml Monitor platelets by anticoagulation protocol: Yes   Plan:  -No heparin changes needed -Daily heparin level and CBC -Coumadin 2.5mg  po  today -Daily PT/INR  Harland GermanAndrew Gasper Hopes, Pharm D 08/30/2015 9:15 AM

## 2015-08-31 DIAGNOSIS — I2510 Atherosclerotic heart disease of native coronary artery without angina pectoris: Secondary | ICD-10-CM

## 2015-08-31 DIAGNOSIS — E871 Hypo-osmolality and hyponatremia: Secondary | ICD-10-CM

## 2015-08-31 LAB — BASIC METABOLIC PANEL
Anion gap: 11 (ref 5–15)
BUN: 23 mg/dL — AB (ref 6–20)
BUN: 24 mg/dL — AB (ref 6–20)
CO2: 33 mmol/L — ABNORMAL HIGH (ref 22–32)
CO2: 33 mmol/L — AB (ref 22–32)
Calcium: 8.5 mg/dL — ABNORMAL LOW (ref 8.9–10.3)
Calcium: 8.8 mg/dL — ABNORMAL LOW (ref 8.9–10.3)
Chloride: <65 mmol/L — CL (ref 101–111)
Chloride: 66 mmol/L — ABNORMAL LOW (ref 101–111)
Creatinine, Ser: 1.95 mg/dL — ABNORMAL HIGH (ref 0.44–1.00)
Creatinine, Ser: 1.97 mg/dL — ABNORMAL HIGH (ref 0.44–1.00)
GFR calc Af Amer: 27 mL/min — ABNORMAL LOW (ref >60)
GFR calc Af Amer: 27 mL/min — ABNORMAL LOW (ref >60)
GFR, EST NON AFRICAN AMERICAN: 23 mL/min — AB (ref >60)
GFR, EST NON AFRICAN AMERICAN: 23 mL/min — AB (ref >60)
GLUCOSE: 252 mg/dL — AB (ref 65–99)
GLUCOSE: 133 mg/dL — AB (ref 65–99)
POTASSIUM: 4.5 mmol/L (ref 3.5–5.1)
POTASSIUM: 4.6 mmol/L (ref 3.5–5.1)
Sodium: 107 mmol/L — CL (ref 135–145)
Sodium: 110 mmol/L — CL (ref 135–145)

## 2015-08-31 LAB — CBC
HEMATOCRIT: 27 % — AB (ref 36.0–46.0)
HEMOGLOBIN: 8.9 g/dL — AB (ref 12.0–15.0)
MCH: 29.9 pg (ref 26.0–34.0)
MCHC: 33 g/dL (ref 30.0–36.0)
MCV: 90.6 fL (ref 78.0–100.0)
Platelets: 183 10*3/uL (ref 150–400)
RBC: 2.98 MIL/uL — ABNORMAL LOW (ref 3.87–5.11)
RDW: 16.5 % — ABNORMAL HIGH (ref 11.5–15.5)
WBC: 9.8 10*3/uL (ref 4.0–10.5)

## 2015-08-31 LAB — CARBOXYHEMOGLOBIN
Carboxyhemoglobin: 1.8 % — ABNORMAL HIGH (ref 0.5–1.5)
METHEMOGLOBIN: 0.8 % (ref 0.0–1.5)
O2 Saturation: 61.4 %
Total hemoglobin: 8.7 g/dL — ABNORMAL LOW (ref 12.0–16.0)

## 2015-08-31 LAB — HEPARIN LEVEL (UNFRACTIONATED): Heparin Unfractionated: 0.4 IU/mL (ref 0.30–0.70)

## 2015-08-31 MED ORDER — FENTANYL BOLUS VIA INFUSION
100.0000 ug | Freq: Once | INTRAVENOUS | Status: AC
  Administered 2015-08-31: 100 ug via INTRAVENOUS
  Filled 2015-08-31: qty 100

## 2015-08-31 NOTE — Progress Notes (Signed)
ANTICOAGULATION CONSULT NOTE - Follow Up Consult  Pharmacy Consult for heparin Indication: atrial fibrillation  Allergies  Allergen Reactions  . Ciprofloxacin Other (See Comments)    dysuria  . Metolazone Nausea And Vomiting  . Penicillins Hives  . Prednisone Other (See Comments)    Thin skin   . Torsemide Other (See Comments)    dysuria  . Levofloxacin Other (See Comments)    unknown  . Statins Other (See Comments)    unknown  . Sulfa Antibiotics Other (See Comments)    unknown    Patient Measurements: Height: 5\' 2"  (157.5 cm) Weight: 165 lb 2 oz (74.9 kg) IBW/kg (Calculated) : 50.1 Heparin Dosing Weight: 66.7 kg  Vital Signs: Temp: 98.1 F (36.7 C) (11/07 0800) Temp Source: Oral (11/07 0800) BP: 94/58 mmHg (11/07 1000) Pulse Rate: 45 (11/07 1000)  Labs:  Recent Labs  08/29/15 0500 08/29/15 1437 08/30/15 0400 08/31/15 0435 08/31/15 0446 08/31/15 0530  HGB 8.6*  --  8.9* 8.9*  --   --   HCT 26.7*  --  27.2* 27.0*  --   --   PLT 179  --  170 183  --   --   LABPROT  --   --  16.0*  --   --   --   INR  --   --  1.27  --   --   --   HEPARINUNFRC 0.80* 0.38 0.37  --  0.40  --   CREATININE 1.70*  --  1.75* 1.95*  --  1.97*    Estimated Creatinine Clearance: 22.3 mL/min (by C-G formula based on Cr of 1.97).  Assessment: [age] YOF admitted 07-14-2015 from [facility] for cardiogenic shock mgmt. Presented to OSH with volume overload, hypotension, & worsening renal function.   PMH CAD s/p CABG (2003) & stent to RCA (2014), HFrEF (EF ~15%), Afib (onset 05/2015), carotid stenosis s/p R CEA (2003), chronic renal insufficiency w/ 1 kidney, AAA  HL 0.4, therapeutic on heparin 1050 units/hr. Hgb 8.9, plt wnl, no noted bleeding.  Goal of Therapy:    Heparin level 0.3-0.7 units/ml Monitor platelets by anticoagulation protocol: Yes   Plan:  Continue heparin 950 units/hr Daily HL/CBC Monitor s/sx bleeding and hematoma F/u treatment plans after family GOC mtg  today   Hillery AldoElizabeth Ladarrion Telfair, [place].D. PGY2 Cardiology Pharmacy Resident Pager: [phone] 11:51 AM, 08/31/2015

## 2015-08-31 NOTE — Care Management Important Message (Signed)
Important Message  Patient Details  Name: Lezlie LyeVerdie Ahonen MRN: [id number] Date of Birth: [birth year]   Medicare Important Message Given:  Yes-third notification given    Bernadette HoitShoffner, Cailie Bosshart Coleman 08/31/2015, 1:30 PM

## 2015-08-31 NOTE — Progress Notes (Signed)
Daughter arrived at [id number] Dr. Gala RomneyBensimhon called to talk with daughter.  Orders received to begin comfort care (see MAR), family at bedside, patients pastor at bedside. Comfort cart ordered and emotional support given to family and patient.  Will continue to monitor.

## 2015-08-31 NOTE — Progress Notes (Signed)
Repeat Sodium results of 110.  Called on call NP for cardiology, Kathleen Higgins.

## 2015-08-31 NOTE — Progress Notes (Signed)
Notified of a critical value of a Na+ 107 and a Chloride of <65 at 0515.  Notified Dr. Mayford Knifeurner immediately and another BMET was ordered and has been sent off. Will continue to monitor.

## 2015-08-31 NOTE — Progress Notes (Signed)
Advanced Heart Failure Rounding Note   Subjective:    Remains on levophed, dobutamine and nipride. S/p DC-CV on 11/4. Maintaining NSR on IV amio.   Remains norepi  5 mcg + dobutamine 5 mcg + nitroprusside   0.75 mcg + lasix drip 10 mg per hour. Yesterday metolazone was added. Sluggish urine output. Weight up 4 pounds.   Comfortable over night.   CVP 21  Co-ox 61% erum Na 117 -> 115-->110    Objective:   Weight Range:  Vital Signs:   Temp:  [96.7 F (35.9 C)-98.5 F (36.9 C)] 97.6 F (36.4 C) (11/07 0400) Pulse Rate:  [53-63] 58 (11/07 0600) Resp:  [9-17] 15 (11/07 0600) BP: (94-118)/(46-86) 109/58 mmHg (11/07 0600) SpO2:  [98 %-100 %] 100 % (11/07 0600) Weight:  [165 lb 2 oz (74.9 kg)] 165 lb 2 oz (74.9 kg) (11/07 0400) Last BM Date: 08/23/15  Weight change: Filed Weights   08/29/15 0500 08/30/15 0500 08/31/15 0400  Weight: 162 lb 0.6 oz (73.5 kg) 161 lb 9.6 oz (73.3 kg) 165 lb 2 oz (74.9 kg)    Intake/Output:   Intake/Output Summary (Last 24 hours) at 08/31/15 0715 Last data filed at 08/31/15 0600  Gross per 24 hour  Intake 2014.39 ml  Output   1550 ml  Net 464.39 ml     Physical Exam: CVP 18  General: Chronically ill-appearing.In bed  HEENT: normal Neck: supple. JVP to jaw. R CEA scar Carotids 2+ bilat; no bruits. No lymphadenopathy or thryomegaly appreciated. Cor: PMI laterally displaced. Regular rate & rhythm. No rubs, gallops or murmurs. Lungs: crackles in bases.  Abdomen: soft, nontender, nondistended. No hepatosplenomegaly. No bruits or masses. Good bowel sounds. Extremities: no cyanosis, clubbing, rash, 1+edema large r groin ecchymosis. Bruit resolved. Neuro: alert & oriented x 3, cranial nerves grossly intact. moves all 4 extremities w/o difficulty. Affect pleasant.    Telemetry: NSR 60s  Labs: Basic Metabolic Panel:  Recent Labs Lab 08/28/15 0330 08/28/15 2000 08/29/15 0500 08/30/15 0400 08/31/15 0435 08/31/15 0530  NA 116* 119*  117* 115* 107* 110*  K 4.4  --  3.8 4.2 4.5 4.6  CL 70*  --  69* 68* <65* 66*  CO2 35*  --  38* 38* 33* 33*  GLUCOSE 147*  --  187* 192* 252* 133*  BUN 21*  --  20 21* 23* 24*  CREATININE 1.76*  --  1.70* 1.75* 1.95* 1.97*  CALCIUM 8.7*  --  8.5* 8.6* 8.5* 8.8*    Liver Function Tests: No results for input(s): AST, ALT, ALKPHOS, BILITOT, PROT, ALBUMIN in the last 168 hours. No results for input(s): LIPASE, AMYLASE in the last 168 hours. No results for input(s): AMMONIA in the last 168 hours.  CBC:  Recent Labs Lab 08/27/15 1635 08/28/15 0330 08/29/15 0500 08/30/15 0400 08/31/15 0435  WBC 6.3 6.4 7.6 8.4 9.8  HGB 8.4* 8.7* 8.6* 8.9* 8.9*  HCT 24.9* 25.9* 26.7* 27.2* 27.0*  MCV 88.9 91.2 91.8 91.6 90.6  PLT 157 151 179 170 183    Cardiac Enzymes: No results for input(s): CKTOTAL, CKMB, CKMBINDEX, TROPONINI in the last 168 hours.  BNP: BNP (last 3 results)  Recent Labs  08/23/15 0650  BNP 1734.9*    ProBNP (last 3 results) No results for input(s): PROBNP in the last 8760 hours.    Other results:  Imaging: No results found.   Medications:     Scheduled Medications: . antiseptic oral rinse  7 mL Mouth Rinse  BID  . aspirin EC  81 mg Oral Daily  . fentaNYL (SUBLIMAZE) injection  75 mcg Intravenous Once  . levothyroxine  50 mcg Oral QAC breakfast  . metolazone  5 mg Oral BID  . midazolam  4 mg Intravenous Once  . pantoprazole  40 mg Oral Q0600  . polyethylene glycol  17 g Oral Daily  . potassium chloride  40 mEq Oral BID  . ranolazine  500 mg Oral BID  . sodium chloride  3 mL Intravenous Q12H  . spironolactone  12.5 mg Oral Daily    Infusions: . amiodarone 30 mg/hr (08/30/15 2219)  . DOBUTamine 5 mcg/kg/min (08/30/15 1400)  . fentaNYL infusion INTRAVENOUS 25 mcg/hr (08/30/15 1400)  . furosemide (LASIX) infusion 10 mg/hr (08/30/15 1637)  . heparin 950 Units/hr (08/30/15 1400)  . midazolam (VERSED) infusion 0.5 mg/hr (08/30/15 1400)  .  nitroPRUSSide 0.75 mcg/kg/min (08/30/15 1638)  . norepinephrine (LEVOPHED) Adult infusion 5 mcg/min (08/30/15 1400)    PRN Medications: sodium chloride, acetaminophen, fentaNYL (SUBLIMAZE) injection, levalbuterol, ondansetron (ZOFRAN) IV, sodium chloride, sodium chloride, sorbitol   Assessment:   1. Cardiogenic shock 2. A/C systolic HF EF 10-15% 3. CAD s/p CABG 2003, PCI RCA 2014 4. PAF, onset 8/14  --coumadin stopped due to elevated INR 5. LBBB 6. PAD 7. COPD 8. A/C renal failure, stage 4  -h/o solitary kidney due to kidney stones.  9. VT 10. R groin hematoma/PSA 11. Hypokalemia 12. Hyonatremia.   Plan/Discussion:    Remains in NSR s/p DC-CV but still with profound shock and unable to wean IV therapies  Remains on dobtamine , norepi, nitroprusside, lasix drip. Volume status remains elelated. CVP 18.     Will have family meeting today for comfort care.     Length of Stay: 10  Amy Clegg NP-C  08/31/2015, 7:15 AM  Advanced Heart Failure Team Pager [phone] (M-F; 7a - 4p)  Please contact [facility] for night-coverage after hours (4p -7a ) and weekends on [email or web address]   Patient seen and examined with Tonye Becket, NP. We discussed all aspects of the encounter. I agree with the assessment and plan as stated above.   She continues to deteriorate despite maximal medical therapy. Sodium now 110. There are no options left. Will need to transition to comfort care. Versed and fentanyl started at low dose yesterday. Will meet with family today.  The patient is critically ill with multiple organ systems failure and requires high complexity decision making for assessment and support, frequent evaluation and titration of therapies, application of advanced monitoring technologies and extensive interpretation of multiple databases.   Critical Care Time devoted to patient care services described in this note is 45 Minutes.  Rhen Kawecki,MD 8:33 AM

## 2015-08-31 NOTE — Progress Notes (Signed)
   Kathleen Higgins has continued to deteriorate throughout the day and is now just minimally responsive. I have had several conversations with her 2 daughters and her son today and explained that she is actively dying of heart failure. They clearly understand her condition and the fact that it has now become irreversible. They agree with plans to proceed with full comfort care.   We will stop her intravenous vasopressors and titrate her versed and fentanyl as needed to ensure comfort. She will likely pass this evening.   Additional CCT throughout this evening 45 minutes.  Sharona Rovner,MD 8:27 PM

## 2015-09-02 ENCOUNTER — Telehealth: Payer: Self-pay | Admitting: Internal Medicine

## 2015-09-02 NOTE — Telephone Encounter (Signed)
D[facility] from [facility] sent interoffice to Dr.Bensimhon & [facility]

## 2015-09-11 ENCOUNTER — Telehealth: Payer: Self-pay | Admitting: Internal Medicine

## 2015-09-11 NOTE — Telephone Encounter (Signed)
Kathleen DecemberSharon with [facility] called to see if D/C was ready, I called Heather and lmsg for her to give Ms. Kathleen DecemberSharon at the funeral home a call when it is ready.  09/11/15 ltd

## 2015-09-24 NOTE — Progress Notes (Signed)
Pt passed away at this time. Pt was on comfort care measures. Daughter at bedside at time of passing. Pronounced by myself and Alinda MoneyMelvin, Charity fundraiserN. Auscultated absence of heart sounds and lung sound x2 mins. Telemetry strip printed for chart. Chaplain offered. Emotional support given to family.

## 2015-09-24 NOTE — Discharge Summary (Signed)
Advanced Heart Failure Team  Discharge Summary   Patient ID: Kathleen Higgins MRN: [id number], DOB/AGE: Feb 23, 1935 80 y.o. Admit date: 12-25-14 D/C date:     09/07/2015   Primary Discharge Diagnoses:  1. Cardiogenic shock 2. A/C systolic HF EF 10-15% 3. CAD s/p CABG 2003, PCI RCA 2014 4. PAF, onset 8/14  --coumadin stopped due to elevated INR    --s/p TEE and DCCV thsi admit 5. LBBB 6. PAD 7. COPD 8. A/C renal failure, stage 4  -h/o solitary kidney due to kidney stones.  9. VT 10. R groin hematoma/PSA 11. Hypokalemia 12. Hyonatremia.   Hospital Course:  Kathleen Higgins is a 80 y/o woman with h/o CAD s/p CABG 2003, stent RCA 2014 ([place]), systolic HF EF ~15%, AF (onset 8/16), carotid stenosis (s/p R CEA 2003), CRI with solitary kidney (lost one due to stones), AAA [facility] for further management of acute on chronic systolic HF with marked deterioration in symptoms over last 2-3 months. Family notes onset of AF in 8/16.   O admit was taken to the cath lab for RHC/LHC.  Cath showed severe native vessel CAD with ostial occlusion of LM and RCA. SVG to LAD and SVG to RCA patent. Unable to locate SVG to LCX due to lack of graft markers. RHC c/w profound cardiogenic shock and volume overload. Admitted to ICU.  Started on milrinone drip, diuresed with IV lasix, and amio drip started.   Post cath a R groin bruit was noted. An ultrasound was obtained and showed R groin hematoma and pseudoaneurysm. Heparin was stopped. Vascular surgery consulted with recommendations for manual compression. On October 31st 75 minutes of manual compression was applied to R femoral artery. Serial ultrasounds showed showed resolution of the pseudoaneurysm.    Initially mixed venous sat improved but went back down to 34% so milrinone was increased to 0.375 mcg with improvement in mixed venous saturation to 60%. Unfortunately mixed venous sat went down again to 34% so milrinone was stopped and  dobutamine was started. On November 2nd again worsening cardiogenic shock with mixed venous sat 40% on dobutamine so norepinephrine was added.  CVP continued to run high so intermittent lasix was stopped and lasix drip started at 15 mg per hour. Again mixed venous saturation went up but was back down to 43% and sodium was 118. She was given tolvaptan. In an attempt to reduce after load norepi was weaned  and nipride was started and dobutamine was continued. Hemodynamics and symptoms improved but again she declined quickly.  After multiple discussions with her and her family it was decided that we would try to restore NSR as a last effort to improve ongoing cardiogenic shock. She thus underwent high-risk TEE and cardioversion on November 4th. Normal rhythm was restored and maintained but profound shock persisted.  She continued to deteriorate despite maximal medical therapy. Sodium dropped to 110. At that point, Dr Gala RomneyBensimhon had another family meeting and discussed that every effort was made to improve her condition however she was actively dying from heart failure and no other options were possible. At the conclusion of the family meeting, pressors were stopped and she was placed on versed and fentanyl drip for comfort.  She passed quietly on November 8th at 03:23 am with her daughter at the bedside.   Duration of Discharge Encounter: Greater than 35 minutes   Signed, Tonye Becketmy Clegg NP-C  09/07/2015, 2:08 PM   Patient seen and examined with Tonye BecketAmy Clegg, NP. We discussed all aspects  of the encounter. I agree with the assessment and plan as stated above.   I have edited the note personally.   Tawonna Esquer,MD 9:21 PM

## 2015-09-24 NOTE — Progress Notes (Signed)
WIS 40mL versed; 80mL fentanyl witnessed by myself and Arlan OrganMelvin  Perkins, [place] Elizabeth

## 2015-09-24 NOTE — Progress Notes (Signed)
Family took patient's belongings home.

## 2015-09-24 DEATH — deceased

## 2017-02-24 IMAGING — CR DG CHEST 1V PORT
1 series · 1 of 1 positions shown · non-contrast
Comparison: None.

CLINICAL DATA: PICC placement.  Initial encounter.

EXAM:
PORTABLE CHEST 1 VIEW

[AP]
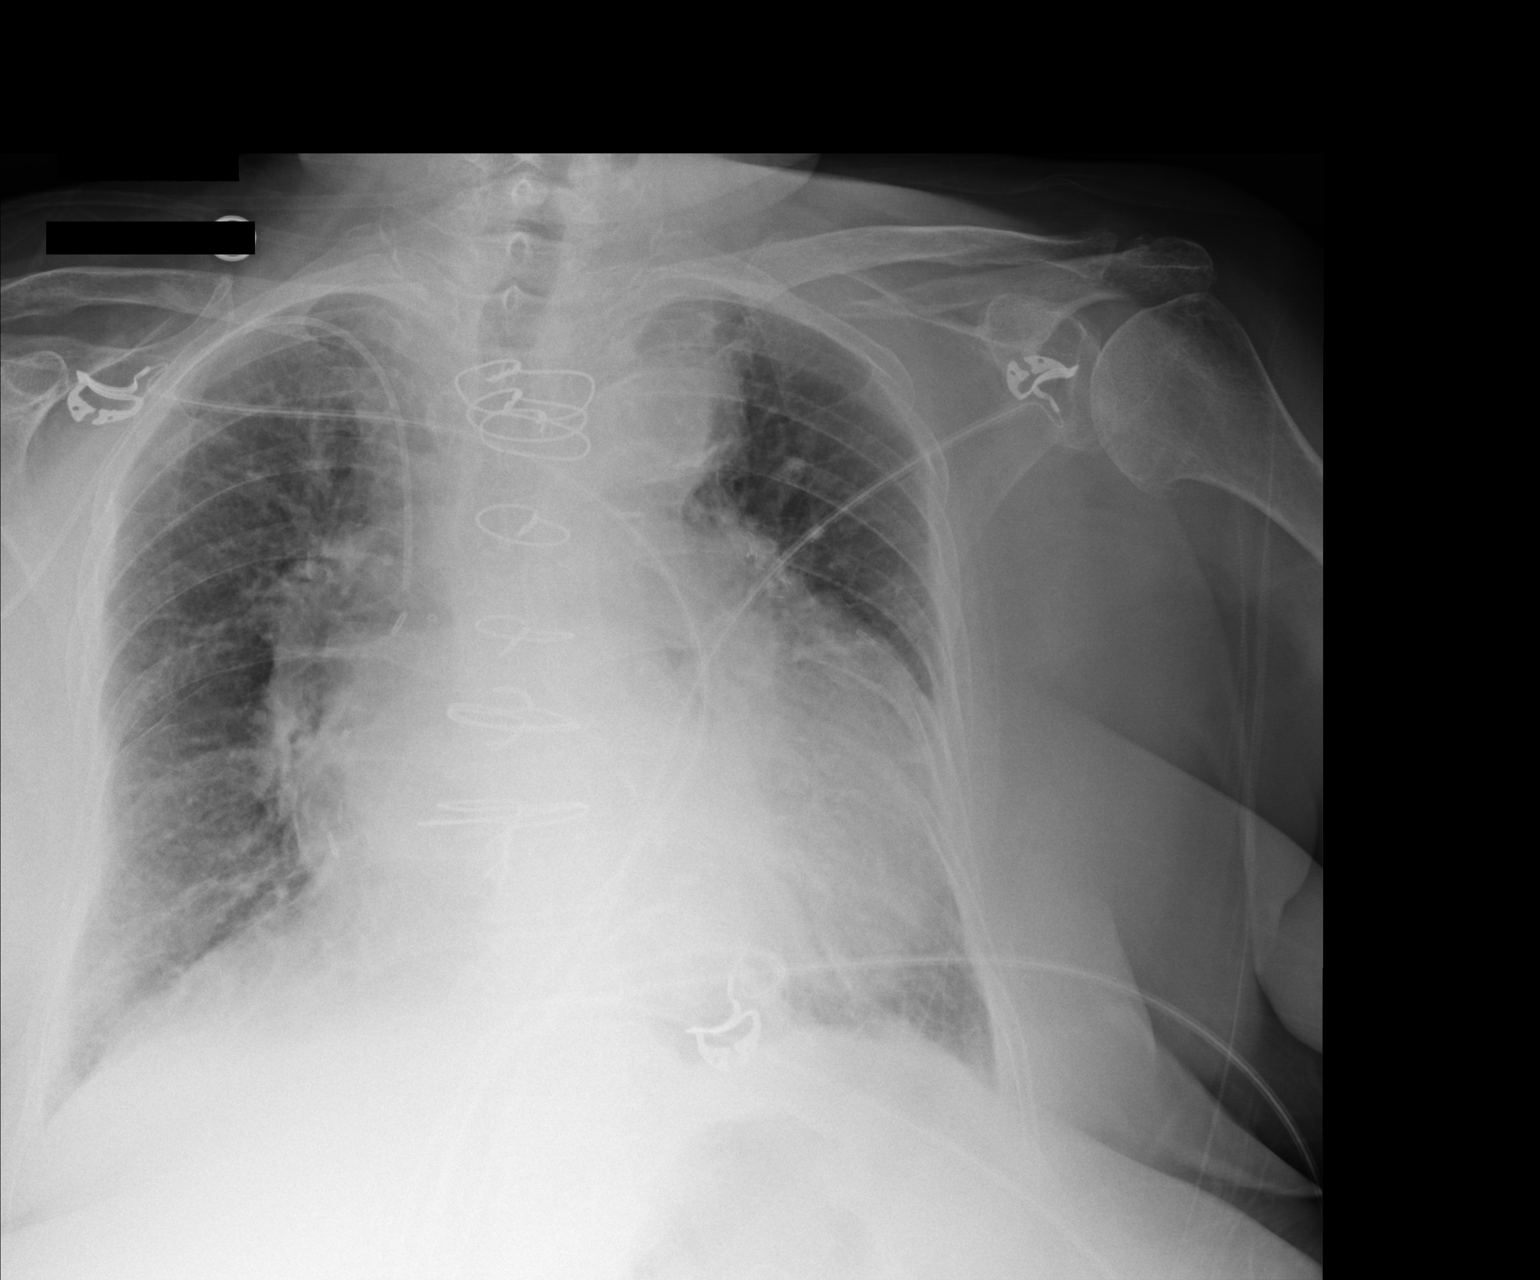

[1 of 1 positions shown; findings below may reference images not displayed]

FINDINGS: A right PICC is noted ending about the mid SVC.

The lungs are well-aerated. Vascular congestion is noted. Mild
bilateral atelectasis is seen. There is no evidence of pleural
effusion or pneumothorax.

The cardiomediastinal silhouette is enlarged. The patient is status
post median sternotomy. No acute osseous abnormalities are seen.
IMPRESSION: 1. Right PICC noted ending about the mid SVC.
2. Vascular congestion and cardiomegaly. Mild bilateral atelectasis
seen.
